# Patient Record
Sex: Female | Born: 1985 | Race: Black or African American | Hispanic: No | Marital: Single | Smoking: Never smoker
Health system: Southern US, Community
[De-identification: ages and names within clinical notes are randomized; demographics above are authoritative.]

## PROBLEM LIST (undated history)

## (undated) DIAGNOSIS — O139 Gestational [pregnancy-induced] hypertension without significant proteinuria, unspecified trimester: Secondary | ICD-10-CM

## (undated) HISTORY — DX: Gestational (pregnancy-induced) hypertension without significant proteinuria, unspecified trimester: O13.9

---

## 2008-10-12 ENCOUNTER — Ambulatory Visit (HOSPITAL_COMMUNITY): Admission: RE | Admit: 2008-10-12 | Discharge: 2008-10-12 | Payer: Self-pay | Admitting: Obstetrics

## 2009-01-08 ENCOUNTER — Ambulatory Visit (HOSPITAL_COMMUNITY): Admission: RE | Admit: 2009-01-08 | Discharge: 2009-01-08 | Payer: Self-pay | Admitting: Obstetrics

## 2009-01-25 ENCOUNTER — Inpatient Hospital Stay (HOSPITAL_COMMUNITY): Admission: AD | Admit: 2009-01-25 | Discharge: 2009-01-31 | Payer: Self-pay | Admitting: Obstetrics

## 2009-03-23 ENCOUNTER — Ambulatory Visit: Payer: Self-pay | Admitting: Internal Medicine

## 2009-03-23 DIAGNOSIS — I10 Essential (primary) hypertension: Secondary | ICD-10-CM | POA: Insufficient documentation

## 2009-04-20 ENCOUNTER — Ambulatory Visit: Payer: Self-pay | Admitting: Internal Medicine

## 2009-06-22 ENCOUNTER — Ambulatory Visit: Payer: Self-pay | Admitting: Internal Medicine

## 2010-12-13 LAB — COMPREHENSIVE METABOLIC PANEL
ALT: 9 U/L (ref 0–35)
Alkaline Phosphatase: 146 U/L — ABNORMAL HIGH (ref 39–117)
CO2: 23 mEq/L (ref 19–32)
Chloride: 104 mEq/L (ref 96–112)
GFR calc non Af Amer: >60 mL/min (ref >60)
Glucose, Bld: 81 mg/dL (ref 70–99)
Potassium: 3.2 mEq/L — ABNORMAL LOW (ref 3.5–5.1)
Sodium: 136 mEq/L (ref 135–145)

## 2010-12-13 LAB — CBC
Hemoglobin: 11.4 g/dL — ABNORMAL LOW (ref 12.0–15.0)
MCHC: 33.9 g/dL (ref 30.0–36.0)
MCV: 82.6 fL (ref 78.0–100.0)
MCV: 84.8 fL (ref 78.0–100.0)
Platelets: 144 10*3/uL — ABNORMAL LOW (ref 150–400)
Platelets: 166 10*3/uL (ref 150–400)
RBC: 3.78 MIL/uL — ABNORMAL LOW (ref 3.87–5.11)
RBC: 4.07 MIL/uL (ref 3.87–5.11)
WBC: 16.9 10*3/uL — ABNORMAL HIGH (ref 4.0–10.5)

## 2010-12-13 LAB — LACTATE DEHYDROGENASE: LDH: 160 U/L (ref 94–250)

## 2011-09-05 NOTE — L&D Delivery Note (Signed)
Delivery Note At 4:19 PM a viable female was delivered via Vaginal, Spontaneous Delivery (Presentation: Vertex ;  ).  APGAR: 8, 9; weight 8 lb 5.9 oz (3795 g).   Placenta status: Intact, Spontaneous.  Cord: 3 vessels with the following complications: None.  Cord pH: none  Anesthesia: Epidural  Episiotomy: None Lacerations: None Suture Repair: none Est. Blood Loss (mL): 300  Mom to postpartum.  Baby to nursery-stable.  Susan Burke A 11/29/2011, 5:28 PM

## 2011-10-23 LAB — ABO/RH: RH Type: POSITIVE

## 2011-10-23 LAB — ANTIBODY SCREEN: Antibody Screen: NEGATIVE

## 2011-10-23 LAB — RUBELLA ANTIBODY, IGM: Rubella: IMMUNE

## 2011-10-23 LAB — HIV ANTIBODY (ROUTINE TESTING W REFLEX): HIV: NONREACTIVE

## 2011-10-23 LAB — HEPATITIS B SURFACE ANTIGEN: Hepatitis B Surface Ag: NEGATIVE

## 2011-11-27 ENCOUNTER — Telehealth (HOSPITAL_COMMUNITY): Payer: Self-pay | Admitting: *Deleted

## 2011-11-27 ENCOUNTER — Other Ambulatory Visit: Payer: Self-pay | Admitting: Obstetrics & Gynecology

## 2011-11-27 NOTE — Telephone Encounter (Signed)
Preadmission screen  

## 2011-11-28 ENCOUNTER — Encounter (HOSPITAL_COMMUNITY): Payer: Self-pay | Admitting: *Deleted

## 2011-11-28 ENCOUNTER — Inpatient Hospital Stay (HOSPITAL_COMMUNITY): Admission: RE | Admit: 2011-11-28 | Payer: Self-pay | Source: Ambulatory Visit

## 2011-11-28 NOTE — Telephone Encounter (Signed)
Preadmission screen  

## 2011-11-29 ENCOUNTER — Encounter (HOSPITAL_COMMUNITY): Payer: Self-pay

## 2011-11-29 ENCOUNTER — Inpatient Hospital Stay (HOSPITAL_COMMUNITY)
Admission: RE | Admit: 2011-11-29 | Discharge: 2011-12-01 | DRG: 774 | Disposition: A | Discharge status: Home / Self Care | Payer: 59 | Source: Ambulatory Visit | Attending: Obstetrics & Gynecology | Admitting: Obstetrics & Gynecology

## 2011-11-29 ENCOUNTER — Inpatient Hospital Stay (HOSPITAL_COMMUNITY): Payer: 59 | Admitting: Anesthesiology

## 2011-11-29 ENCOUNTER — Encounter (HOSPITAL_COMMUNITY): Payer: Self-pay | Admitting: Anesthesiology

## 2011-11-29 VITALS — BP 151/98 | HR 91 | Temp 98.3°F | Resp 18 | Ht 63.0 in | Wt 189.0 lb

## 2011-11-29 DIAGNOSIS — I10 Essential (primary) hypertension: Secondary | ICD-10-CM | POA: Insufficient documentation

## 2011-11-29 DIAGNOSIS — O2650 Maternal hypotension syndrome, unspecified trimester: Secondary | ICD-10-CM | POA: Diagnosis not present

## 2011-11-29 DIAGNOSIS — O139 Gestational [pregnancy-induced] hypertension without significant proteinuria, unspecified trimester: Principal | ICD-10-CM | POA: Diagnosis present

## 2011-11-29 LAB — URINALYSIS, DIPSTICK ONLY
Hgb urine dipstick: NEGATIVE
Nitrite: NEGATIVE
Specific Gravity, Urine: 1.02 (ref 1.005–1.030)
Urobilinogen, UA: 0.2 mg/dL (ref 0.0–1.0)

## 2011-11-29 LAB — APTT: aPTT: 28 seconds (ref 24–37)

## 2011-11-29 LAB — PROTIME-INR
INR: 1.11 (ref 0.00–1.49)
Prothrombin Time: 14.5 seconds (ref 11.6–15.2)

## 2011-11-29 LAB — CBC
HCT: 23.7 % — ABNORMAL LOW (ref 36.0–46.0)
Hemoglobin: 7.5 g/dL — ABNORMAL LOW (ref 12.0–15.0)
MCH: 24.5 pg — ABNORMAL LOW (ref 26.0–34.0)
MCV: 77.6 fL — ABNORMAL LOW (ref 78.0–100.0)
Platelets: 196 10*3/uL (ref 150–400)
RBC: 4.16 MIL/uL (ref 3.87–5.11)
RDW: 15.6 % — ABNORMAL HIGH (ref 11.5–15.5)
RDW: 15.7 % — ABNORMAL HIGH (ref 11.5–15.5)
WBC: 17.5 10*3/uL — ABNORMAL HIGH (ref 4.0–10.5)

## 2011-11-29 LAB — LACTATE DEHYDROGENASE: LDH: 253 U/L — ABNORMAL HIGH (ref 94–250)

## 2011-11-29 LAB — COMPREHENSIVE METABOLIC PANEL
Albumin: 2.9 g/dL — ABNORMAL LOW (ref 3.5–5.2)
BUN: 6 mg/dL (ref 6–23)
Chloride: 104 mEq/L (ref 96–112)
Creatinine, Ser: 0.7 mg/dL (ref 0.50–1.10)
GFR calc Af Amer: >90 mL/min (ref >90)
GFR calc non Af Amer: >90 mL/min (ref >90)
Glucose, Bld: 78 mg/dL (ref 70–99)
Total Bilirubin: 0.2 mg/dL — ABNORMAL LOW (ref 0.3–1.2)

## 2011-11-29 MED ORDER — LACTATED RINGERS IV BOLUS (SEPSIS)
1000.0000 mL | Freq: Once | INTRAVENOUS | Status: AC
  Administered 2011-11-29 (×2): 1000 mL via INTRAVENOUS

## 2011-11-29 MED ORDER — OXYTOCIN 20 UNITS IN LACTATED RINGERS INFUSION - SIMPLE: 125.0000 mL/h | Freq: Once | INTRAVENOUS | Status: DC

## 2011-11-29 MED ORDER — LIDOCAINE HCL (PF) 1 % IJ SOLN
30.0000 mL | INTRAMUSCULAR | Status: DC | PRN
  Filled 2011-11-29 (×2): qty 30

## 2011-11-29 MED ORDER — PHENYLEPHRINE 40 MCG/ML (10ML) SYRINGE FOR IV PUSH (FOR BLOOD PRESSURE SUPPORT): 80.0000 ug | PREFILLED_SYRINGE | INTRAVENOUS | Status: DC | PRN

## 2011-11-29 MED ORDER — OXYTOCIN BOLUS FROM INFUSION
500.0000 mL | Freq: Once | INTRAVENOUS | Status: DC
  Filled 2011-11-29: qty 500

## 2011-11-29 MED ORDER — WITCH HAZEL-GLYCERIN EX PADS
1.0000 "application " | MEDICATED_PAD | CUTANEOUS | Status: DC | PRN
  Administered 2011-11-30: 1 via TOPICAL

## 2011-11-29 MED ORDER — LANOLIN HYDROUS EX OINT: TOPICAL_OINTMENT | CUTANEOUS | Status: DC | PRN

## 2011-11-29 MED ORDER — HYDROXYZINE HCL 50 MG PO TABS
50.0000 mg | ORAL_TABLET | Freq: Four times a day (QID) | ORAL | Status: DC | PRN
  Filled 2011-11-29: qty 1

## 2011-11-29 MED ORDER — BUTORPHANOL TARTRATE 2 MG/ML IJ SOLN: 1.0000 mg | INTRAMUSCULAR | Status: DC | PRN

## 2011-11-29 MED ORDER — ACETAMINOPHEN 325 MG PO TABS: 650.0000 mg | ORAL_TABLET | ORAL | Status: DC | PRN

## 2011-11-29 MED ORDER — PHENYLEPHRINE 40 MCG/ML (10ML) SYRINGE FOR IV PUSH (FOR BLOOD PRESSURE SUPPORT)
80.0000 ug | PREFILLED_SYRINGE | INTRAVENOUS | Status: DC | PRN
  Filled 2011-11-29: qty 5

## 2011-11-29 MED ORDER — SODIUM BICARBONATE 8.4 % IV SOLN
INTRAVENOUS | Status: DC | PRN
  Administered 2011-11-29: 4 mL via EPIDURAL

## 2011-11-29 MED ORDER — DIBUCAINE 1 % RE OINT: 1.0000 "application " | TOPICAL_OINTMENT | RECTAL | Status: DC | PRN

## 2011-11-29 MED ORDER — BENZOCAINE-MENTHOL 20-0.5 % EX AERO: 1.0000 "application " | INHALATION_SPRAY | CUTANEOUS | Status: DC | PRN

## 2011-11-29 MED ORDER — ONDANSETRON HCL 4 MG/2ML IJ SOLN: 4.0000 mg | Freq: Four times a day (QID) | INTRAMUSCULAR | Status: DC | PRN

## 2011-11-29 MED ORDER — SENNOSIDES-DOCUSATE SODIUM 8.6-50 MG PO TABS: 2.0000 | ORAL_TABLET | Freq: Every day | ORAL | Status: DC

## 2011-11-29 MED ORDER — OXYCODONE-ACETAMINOPHEN 5-325 MG PO TABS: 1.0000 | ORAL_TABLET | ORAL | Status: DC | PRN

## 2011-11-29 MED ORDER — DIPHENHYDRAMINE HCL 50 MG/ML IJ SOLN: 12.5000 mg | INTRAMUSCULAR | Status: DC | PRN

## 2011-11-29 MED ORDER — OXYTOCIN 10 UNIT/ML IJ SOLN
40.0000 [IU] | INTRAVENOUS | Status: DC
  Administered 2011-11-29: 40 [IU] via INTRAVENOUS
  Filled 2011-11-29: qty 4

## 2011-11-29 MED ORDER — ONDANSETRON HCL 4 MG/2ML IJ SOLN: 4.0000 mg | INTRAMUSCULAR | Status: DC | PRN

## 2011-11-29 MED ORDER — OXYTOCIN 20 UNITS IN LACTATED RINGERS INFUSION - SIMPLE: 500.0000 mL/h | INTRAVENOUS | Status: DC

## 2011-11-29 MED ORDER — LABETALOL HCL 200 MG PO TABS
200.0000 mg | ORAL_TABLET | Freq: Three times a day (TID) | ORAL | Status: DC
Start: 2011-11-29 — End: 2011-12-01
  Administered 2011-11-29 – 2011-12-01 (×5): 200 mg via ORAL
  Filled 2011-11-29 (×5): qty 1

## 2011-11-29 MED ORDER — DIPHENHYDRAMINE HCL 25 MG PO CAPS: 25.0000 mg | ORAL_CAPSULE | Freq: Four times a day (QID) | ORAL | Status: DC | PRN

## 2011-11-29 MED ORDER — HYDROXYZINE HCL 50 MG/ML IM SOLN
50.0000 mg | Freq: Four times a day (QID) | INTRAMUSCULAR | Status: DC | PRN
  Filled 2011-11-29: qty 1

## 2011-11-29 MED ORDER — LACTATED RINGERS IV SOLN: 500.0000 mL | INTRAVENOUS | Status: DC | PRN

## 2011-11-29 MED ORDER — MISOPROSTOL 25 MCG QUARTER TABLET: 25.0000 ug | ORAL_TABLET | ORAL | Status: DC

## 2011-11-29 MED ORDER — CARBOPROST TROMETHAMINE 250 MCG/ML IM SOLN
INTRAMUSCULAR | Status: AC
  Administered 2011-11-29: 250 ug
  Filled 2011-11-29: qty 1

## 2011-11-29 MED ORDER — TETANUS-DIPHTH-ACELL PERTUSSIS 5-2.5-18.5 LF-MCG/0.5 IM SUSP: 0.5000 mL | Freq: Once | INTRAMUSCULAR | Status: DC

## 2011-11-29 MED ORDER — EPHEDRINE 5 MG/ML INJ
10.0000 mg | INTRAVENOUS | Status: DC | PRN
  Filled 2011-11-29: qty 4

## 2011-11-29 MED ORDER — OXYTOCIN 20 UNITS IN LACTATED RINGERS INFUSION - SIMPLE
1.0000 m[IU]/min | INTRAVENOUS | Status: DC
  Filled 2011-11-29 (×2): qty 1000

## 2011-11-29 MED ORDER — OXYTOCIN BOLUS FROM INFUSION: 500.0000 mL | Freq: Once | INTRAVENOUS | Status: DC

## 2011-11-29 MED ORDER — OXYTOCIN 20 UNITS IN LACTATED RINGERS INFUSION - SIMPLE: 1.0000 m[IU]/min | INTRAVENOUS | Status: DC

## 2011-11-29 MED ORDER — FENTANYL 2.5 MCG/ML BUPIVACAINE 1/10 % EPIDURAL INFUSION (WH - ANES)
14.0000 mL/h | INTRAMUSCULAR | Status: DC
  Filled 2011-11-29: qty 60

## 2011-11-29 MED ORDER — IBUPROFEN 600 MG PO TABS
600.0000 mg | ORAL_TABLET | Freq: Four times a day (QID) | ORAL | Status: DC
  Administered 2011-11-29 – 2011-12-01 (×6): 600 mg via ORAL
  Filled 2011-11-29 (×6): qty 1

## 2011-11-29 MED ORDER — SIMETHICONE 80 MG PO CHEW: 80.0000 mg | CHEWABLE_TABLET | ORAL | Status: DC | PRN

## 2011-11-29 MED ORDER — IBUPROFEN 600 MG PO TABS
600.0000 mg | ORAL_TABLET | Freq: Four times a day (QID) | ORAL | Status: DC | PRN
Start: 2011-11-29 — End: 2011-11-29

## 2011-11-29 MED ORDER — LABETALOL HCL 200 MG PO TABS
200.0000 mg | ORAL_TABLET | Freq: Three times a day (TID) | ORAL | Status: DC
  Administered 2011-11-29 (×2): 200 mg via ORAL
  Filled 2011-11-29 (×4): qty 1

## 2011-11-29 MED ORDER — TERBUTALINE SULFATE 1 MG/ML IJ SOLN: 0.2500 mg | Freq: Once | INTRAMUSCULAR | Status: DC | PRN

## 2011-11-29 MED ORDER — ZOLPIDEM TARTRATE 5 MG PO TABS: 5.0000 mg | ORAL_TABLET | Freq: Every evening | ORAL | Status: DC | PRN

## 2011-11-29 MED ORDER — PRENATAL MULTIVITAMIN CH
1.0000 | ORAL_TABLET | Freq: Every day | ORAL | Status: DC
  Administered 2011-11-30 – 2011-12-01 (×2): 1 via ORAL
  Filled 2011-11-29 (×2): qty 1

## 2011-11-29 MED ORDER — LACTATED RINGERS IV SOLN
INTRAVENOUS | Status: DC
  Administered 2011-11-29 (×3): via INTRAVENOUS

## 2011-11-29 MED ORDER — OXYTOCIN 20 UNITS IN LACTATED RINGERS INFUSION - SIMPLE: 125.0000 mL/h | INTRAVENOUS | Status: DC | PRN

## 2011-11-29 MED ORDER — EPHEDRINE 5 MG/ML INJ: 10.0000 mg | INTRAVENOUS | Status: DC | PRN

## 2011-11-29 MED ORDER — CITRIC ACID-SODIUM CITRATE 334-500 MG/5ML PO SOLN: 30.0000 mL | ORAL | Status: DC | PRN

## 2011-11-29 MED ORDER — FENTANYL 2.5 MCG/ML BUPIVACAINE 1/10 % EPIDURAL INFUSION (WH - ANES)
INTRAMUSCULAR | Status: DC | PRN
  Administered 2011-11-29: 13 mL/h via EPIDURAL

## 2011-11-29 MED ORDER — LACTATED RINGERS IV SOLN: 500.0000 mL | Freq: Once | INTRAVENOUS | Status: DC

## 2011-11-29 MED ORDER — ONDANSETRON HCL 4 MG PO TABS: 4.0000 mg | ORAL_TABLET | ORAL | Status: DC | PRN

## 2011-11-29 MED ORDER — MISOPROSTOL 25 MCG QUARTER TABLET
25.0000 ug | ORAL_TABLET | ORAL | Status: DC
Start: 2011-11-29 — End: 2011-11-29
  Administered 2011-11-29: 25 ug via VAGINAL
  Filled 2011-11-29 (×5): qty 1
  Filled 2011-11-29: qty 0.25

## 2011-11-29 NOTE — Progress Notes (Signed)
Patient ID: Susan Burke, female   DOB: February 14, 1986, 26 y.o.   MRN: [id number] Subjective: CTSP s/p a presyncopal episode/heavy vaginal bleeding  Objective: Vital signs in last 24 hours: Temp:  [98.3 F (36.8 C)-99.3 F (37.4 C)] 99.3 F (37.4 C) (03/27 2016) Pulse Rate:  [28-121] 93  (03/27 2016) Resp:  [16-20] 16  (03/27 2016) BP: (129-173)/(73-135) 146/98 mmHg (03/27 2016) SpO2:  [95 %-100 %] 96 % (03/27 1621) Weight:  [85.73 kg (189 lb)] 85.73 kg (189 lb) (03/27 0753)      General:  Alert FH:  @ U/firm SVE: no clots in LUS     Scheduled Meds:   . ibuprofen  600 mg Oral Q6H  . labetalol  200 mg Oral TID  . lactated ringers  1,000 mL Intravenous Once  . prenatal multivitamin  1 tablet Oral Daily  . senna-docusate  2 tablet Oral QHS  . TDaP  0.5 mL Intramuscular Once  . DISCONTD: carboprost      . DISCONTD: labetalol  200 mg Oral TID  . DISCONTD: lactated ringers  500 mL Intravenous Once  . DISCONTD: misoprostol  25 mcg Vaginal Q4H  . DISCONTD: misoprostol  25 mcg Vaginal Q4H  . DISCONTD: oxytocin 20 units in LR 1000 mL  500 mL Intravenous Once  . DISCONTD: oxytocin 20 units in LR 1000 mL  500 mL Intravenous Once  . DISCONTD: oxytocin 20 units in LR 1000 mL  125 mL/hr Intravenous Once  . DISCONTD: oxytocin 20 units in LR 1000 mL  125 mL/hr Intravenous Once    Assessment/Plan: ?Vasovagal  Episode ?Episode of uterine atony--good tone s/p Hemabate  -->Foley catheter -->CBC/PT/PTT -->Pitocin -->IVF bolus now -->Monitor PV loss/I&O    LOS: 0 days   JACKSON-MOORE,Chanse Kagel A

## 2011-11-29 NOTE — Progress Notes (Signed)
Susan Burke is a 26 y.o. G2P1001 at [redacted]w[redacted]d by LMP admitted for induction of labor due to Hypertension.  Subjective:   Objective: BP 153/99  Pulse 80  Temp(Src) 98.3 F (36.8 C) (Oral)  Resp 20  Ht 5\' 3"  (1.6 m)  Wt 85.73 kg (189 lb)  BMI 33.48 kg/m2  SpO2 100%  LMP 03/24/2011      FHT:  FHR: 150 bpm, variability: moderate,  accelerations:  Present,  decelerations:  Absent UC:   regular, every 2-5 minutes SVE:   Dilation: 4 Effacement (%): 60 Station: -2 Exam by:: Clearence Cheek RN  Labs: Lab Results  Component Value Date   WBC 9.6 11/29/2011   HGB 10.2* 11/29/2011   HCT 32.3* 11/29/2011   MCV 77.6* 11/29/2011   PLT 196 11/29/2011    Assessment / Plan: Induction of labor due to gestational hypertension,  progressing well on pitocin  Labor: Progressing normally Preeclampsia:  no signs or symptoms of toxicity Fetal Wellbeing:  Category I Pain Control:  Epidural I/D:  n/a Anticipated MOD:  NSVD  Susan Burke A 11/29/2011, 2:26 PM

## 2011-11-29 NOTE — Anesthesia Procedure Notes (Signed)

## 2011-11-29 NOTE — H&P (Signed)
Susan Burke is a 26 y.o. female presenting for IOL for PIH.. Maternal Medical History:  Fetal activity: Perceived fetal activity is normal.   Last perceived fetal movement was within the past hour.    Prenatal complications: Hypertension.   Prenatal Complications - Diabetes: none.    OB History    Grav Para Term Preterm Abortions TAB SAB Ect Mult Living   2 1 1       1      Past Medical History  Diagnosis Date  . Pregnancy induced hypertension    History reviewed. No pertinent past surgical history. Family History: family history includes Diabetes in her maternal grandmother and Hypertension in her maternal grandmother. Social History:  reports that she has never smoked. She has never used smokeless tobacco. She reports that she does not drink alcohol or use illicit drugs.  Review of Systems  All other systems reviewed and are negative.    Dilation: 2 Effacement (%): Thick Station: -3 Exam by:: kConnie Peach RN Blood pressure 161/101, pulse 74, temperature 99 F (37.2 C), temperature source Oral, resp. rate 20, height 5\' 3"  (1.6 m), weight 85.73 kg (189 lb), last menstrual period 03/24/2011. Maternal Exam:  Abdomen: Patient reports no abdominal tenderness. Fetal presentation: vertex  Introitus: Normal vulva. Normal vagina.  Pelvis: adequate for delivery.   Cervix: Cervix evaluated by digital exam.     Physical Exam  Nursing note and vitals reviewed. Constitutional: She is oriented to person, place, and time. She appears well-developed and well-nourished.  HENT:  Head: Normocephalic.  Eyes: Conjunctivae are normal. Pupils are equal, round, and reactive to light.  Neck: Normal range of motion. Neck supple.  Cardiovascular: Normal rate and regular rhythm.   Respiratory: Effort normal.  GI: Soft.  Genitourinary: Vagina normal and uterus normal.  Musculoskeletal: Normal range of motion.  Neurological: She is alert and oriented to person, place, and time.  Skin: Skin  is warm and dry.  Psychiatric: She has a normal mood and affect. Her behavior is normal. Judgment and thought content normal.    Prenatal labs: ABO, Rh: A/Positive/-- (02/18 0000) Antibody: Negative (02/18 0000) Rubella: Immune (02/18 0000) RPR: Nonreactive (02/18 0000)  HBsAg: Negative (02/18 0000)  HIV: Non-reactive (02/18 0000)  GBS: Negative (03/15 0000)   Assessment/Plan: 39 weeks.  PIH.  2 stage IOL.   Rebeka Kimble A 11/29/2011, 8:32 AM

## 2011-11-29 NOTE — Progress Notes (Signed)
Susan Burke is a 26 y.o. G2P1001 at [redacted]w[redacted]d by LMP admitted for induction of labor due to Post dates. Due date 12-06-2011.  Subjective:   Objective: BP 161/101  Pulse 74  Temp(Src) 99 F (37.2 C) (Oral)  Resp 20  Ht 5\' 3"  (1.6 m)  Wt 85.73 kg (189 lb)  BMI 33.48 kg/m2  LMP 03/24/2011      FHT:  FHR: 150 bpm, variability: moderate,  accelerations:  Present,  decelerations:  Absent UC:   Occasional, mild SVE:   Dilation: 2 Effacement (%): Thick Station: -3 Exam by:: [employer] RN  Labs: Lab Results  Component Value Date   WBC 9.6 11/29/2011   HGB 10.2* 11/29/2011   HCT 32.3* 11/29/2011   MCV 77.6* 11/29/2011   PLT 196 11/29/2011    Assessment / Plan: Induction of labor due to gestational hypertension,  progressing well on pitocin  Labor:   Cytotec cervical ripening started. Preeclampsia:  no signs or symptoms of toxicity Fetal Wellbeing:  Category I Pain Control:  Labor support without medications I/D:  n/a Anticipated MOD:  NSVD  Susan Burke A 11/29/2011, 8:38 AM

## 2011-11-29 NOTE — Anesthesia Preprocedure Evaluation (Addendum)
Anesthesia Evaluation  Patient identified by MRN, date of birth, ID band Patient awake    Reviewed: Allergy & Precautions, H&P , Patient's Chart, lab work & pertinent test results  Airway Mallampati: II TM Distance: >3 FB Neck ROM: full    Dental  (+) Teeth Intact   Pulmonary  breath sounds clear to auscultation        Cardiovascular hypertension, On Medications Rhythm:regular Rate:Normal     Neuro/Psych    GI/Hepatic   Endo/Other    Renal/GU      Musculoskeletal   Abdominal   Peds  Hematology   Anesthesia Other Findings       Reproductive/Obstetrics (+) Pregnancy                           Anesthesia Physical Anesthesia Plan  ASA: III  Anesthesia Plan: Epidural   Post-op Pain Management:    Induction:   Airway Management Planned:   Additional Equipment:   Intra-op Plan:   Post-operative Plan:   Informed Consent: I have reviewed the patients History and Physical, chart, labs and discussed the procedure including the risks, benefits and alternatives for the proposed anesthesia with the patient or authorized representative who has indicated his/her understanding and acceptance.   Dental Advisory Given  Plan Discussed with:   Anesthesia Plan Comments: (Labs checked- platelets confirmed with RN in room. Fetal heart tracing, per RN, reported to be stable enough for sitting procedure. Discussed epidural, and patient consents to the procedure:  included risk of possible headache,backache, failed block, allergic reaction, and nerve injury. This patient was asked if she had any questions or concerns before the procedure started.)        Anesthesia Quick Evaluation  

## 2011-11-29 NOTE — Progress Notes (Signed)
HAIDYN KILBURG is a 26 y.o. G2P2002 at [redacted]w[redacted]d by LMP admitted for induction of labor due to Hypertension.  Subjective:   Objective: BP 159/91  Pulse 77  Temp(Src) 98.6 F (37 C) (Oral)  Resp 20  Ht 5\' 3"  (1.6 m)  Wt 85.73 kg (189 lb)  BMI 33.48 kg/m2  SpO2 96%  LMP 03/24/2011  Breastfeeding? Unknown   Total I/O In: -  Out: 300 [Blood:300]  FHT:  FHR: 140 bpm, variability: moderate,  accelerations:  Present,  decelerations:  Present variables UC:   regular, every 3 minutes SVE:   Dilation: 10 Effacement (%): 100 Station: +1;+2 Exam by:: Clearence Cheek RN  Labs: Lab Results  Component Value Date   WBC 9.6 11/29/2011   HGB 10.2* 11/29/2011   HCT 32.3* 11/29/2011   MCV 77.6* 11/29/2011   PLT 196 11/29/2011    Assessment / Plan: Induction of labor due to gestational hypertension,  progressing well on pitocin  Labor: Progressing normally Preeclampsia:  n/a Fetal Wellbeing:  Category II Pain Control:  Epidural I/D:  n/a Anticipated MOD:  NSVD  Dhruva Orndoff A 11/29/2011, 5:31 PM

## 2011-11-30 ENCOUNTER — Inpatient Hospital Stay (HOSPITAL_COMMUNITY): Admission: RE | Admit: 2011-11-30 | Payer: 59 | Source: Ambulatory Visit | Admitting: Obstetrics & Gynecology

## 2011-11-30 LAB — BASIC METABOLIC PANEL
Chloride: 104 mEq/L (ref 96–112)
Creatinine, Ser: 0.67 mg/dL (ref 0.50–1.10)
GFR calc Af Amer: >90 mL/min (ref >90)
GFR calc non Af Amer: >90 mL/min (ref >90)
Potassium: 4.2 mEq/L (ref 3.5–5.1)

## 2011-11-30 LAB — CBC
Platelets: 152 10*3/uL (ref 150–400)
RBC: 2.65 MIL/uL — ABNORMAL LOW (ref 3.87–5.11)
RDW: 15.8 % — ABNORMAL HIGH (ref 11.5–15.5)
WBC: 16.6 10*3/uL — ABNORMAL HIGH (ref 4.0–10.5)

## 2011-11-30 LAB — SAMPLE TO BLOOD BANK

## 2011-11-30 MED ORDER — LACTATED RINGERS IV SOLN
INTRAVENOUS | Status: DC
  Administered 2011-11-30: 07:00:00 via INTRAVENOUS

## 2011-11-30 NOTE — Progress Notes (Signed)
Post Partum Day 1 Subjective: no complaints, up ad lib, voiding, tolerating PO and + flatus  Objective: Blood pressure 112/72, pulse 90, temperature 98.1 F (36.7 C), temperature source Oral, resp. rate 18, height 5\' 3"  (1.6 m), weight 85.73 kg (189 lb), last menstrual period 03/24/2011, SpO2 97.00%, unknown if currently breastfeeding.  Physical Exam:  General: alert and no distress Lochia: appropriate Uterine Fundus: firm Incision: n/a DVT Evaluation: No evidence of DVT seen on physical exam.   Basename 11/30/11 0545 11/29/11 2235  HGB 6.6* 7.5*  HCT 20.7* 23.7*    Assessment/Plan: Plan for discharge tomorrow   LOS: 1 day   Coe Angelos A 11/30/2011, 8:41 AM

## 2011-11-30 NOTE — Anesthesia Postprocedure Evaluation (Signed)
  Anesthesia Post-op Note  Patient: Susan Burke  Procedure(s) Performed: * No surgery found *  Patient Location: Mother/Baby  Anesthesia Type: Epidural  Level of Consciousness: awake  Airway and Oxygen Therapy: Patient Spontanous Breathing  Post-op Pain: none  Post-op Assessment: Patient's Cardiovascular Status Stable, Respiratory Function Stable, Patent Airway, No signs of Nausea or vomiting, Adequate PO intake, Pain level controlled, No headache, No backache, No residual numbness and No residual motor weakness  Post-op Vital Signs: Reviewed and stable  Complications: No apparent anesthesia complications

## 2011-11-30 NOTE — Progress Notes (Signed)
CRITICAL VALUE ALERT  Critical value received:  Hgb: 6.6  Date of notification:  11/30/11  Time of notification:  0623  Critical value read back:yes  Nurse who received alert:  Mont Dutton, RN  MD notified (1st page):    Time of first page:   MD notified (2nd page):  Time of second page:  Responding MD:    Time MD responded:

## 2011-11-30 NOTE — Plan of Care (Signed)
Problem: Discharge Progression Outcomes Goal: Barriers To Progression Addressed/Resolved Outcome: Completed/Met Date Met:  11/30/11 ppHemm  11/29/11

## 2011-11-30 NOTE — Progress Notes (Signed)
UR Chart review completed.  

## 2011-12-01 MED ORDER — LABETALOL HCL 200 MG PO TABS: 200.0000 mg | ORAL_TABLET | Freq: Three times a day (TID) | ORAL | Status: DC

## 2011-12-01 MED ORDER — FERROUS SULFATE 325 (65 FE) MG PO TABS
325.0000 mg | ORAL_TABLET | Freq: Three times a day (TID) | ORAL | Status: DC
  Administered 2011-12-01: 325 mg via ORAL
  Filled 2011-12-01: qty 1

## 2011-12-01 MED ORDER — FERROUS SULFATE 325 (65 FE) MG PO TABS: 325.0000 mg | ORAL_TABLET | Freq: Three times a day (TID) | ORAL | Status: DC

## 2011-12-01 MED ORDER — IBUPROFEN 600 MG PO TABS: 600.0000 mg | ORAL_TABLET | Freq: Four times a day (QID) | ORAL | Status: AC | PRN

## 2011-12-01 NOTE — Discharge Instructions (Signed)
Hypertension Information As your heart beats, it forces blood through your arteries. This force is your blood pressure. If the pressure is too high, it is called hypertension (HTN) or high blood pressure. HTN is dangerous because you may have it and not know it. High blood pressure may mean that your heart has to work harder to pump blood. Your arteries may be narrow or stiff. The extra work puts you at risk for heart disease, stroke, and other problems.  Blood pressure consists of two numbers, a higher number over a lower, 110/72, for example. It is stated as "110 over 72." The ideal is below 120 for the top number (systolic) and under 80 for the bottom (diastolic).  You should pay close attention to your blood pressure if you have certain conditions such as:  Heart failure.   Prior heart attack.   Diabetes   Chronic kidney disease.   Prior stroke.   Multiple risk factors for heart disease.  To see if you have HTN, your blood pressure should be measured while you are seated with your arm held at the level of the heart. It should be measured at least twice. A one-time elevated blood pressure reading (especially in the Emergency Department) does not mean that you need treatment. There may be conditions in which the blood pressure is different between your right and left arms. It is important to see your caregiver soon for a recheck. Most people have essential hypertension which means that there is not a specific cause. This type of high blood pressure may be lowered by changing lifestyle factors such as:  Stress.   Smoking.   Lack of exercise.   Excessive weight.   Drug/tobacco/alcohol use.   Eating less salt.  Most people do not have symptoms from high blood pressure until it has caused damage to the body. Effective treatment can often prevent, delay or reduce that damage. TREATMENT  Treatment for high blood pressure, when a cause has been identified, is directed at the cause. There  are a large number of medications to treat HTN. These fall into several categories, and your caregiver will help you select the medicines that are best for you. Medications may have side effects. You should review side effects with your caregiver. If your blood pressure stays high after you have made lifestyle changes or started on medicines,   Your medication(s) may need to be changed.   Other problems may need to be addressed.   Be certain you understand your prescriptions, and know how and when to take your medicine.   Be sure to follow up with your caregiver within the time frame advised (usually within two weeks) to have your blood pressure rechecked and to review your medications.   If you are taking more than one medicine to lower your blood pressure, make sure you know how and at what times they should be taken. Taking two medicines at the same time can result in blood pressure that is too low.  Document Released: 10/24/2005 Document Revised: 05/03/2011 Document Reviewed: 10/31/2007 [facility] Patient Information 2012 [place], [place].  Postpartum Care After Vaginal Delivery After you deliver your baby, you will stay in the hospital for 24 to 72 hours, unless there were problems with the labor or delivery, or you have medical problems. While you are in the hospital, you will receive help and instructions on how to care for yourself and your baby. Your doctor will order pain medicine, in case you need it. You will have  a small amount of bleeding from your vagina and should change your sanitary pad frequently. Wash your hands thoroughly with soap and water for at least 20 seconds after changing pads and using the toilet. Let the nurses know if you begin to pass blood clots or your bleeding increases. Do not flush blood clots down the toilet before having the nurse look at them, to make sure there is no placental tissue with them. If you had an intravenous (IV), it will be removed within 24  hours, if there are no problems. The first time you get out of bed or take a shower, call the nurse to help you because you may get weak, lightheaded, or even faint. If you are breastfeeding, you may feel painful contractions of your uterus for a couple of weeks. This is normal. The contractions help your uterus get back to normal size. If you are not breastfeeding, wear a supportive bra and handle your breasts as little as possible until your milk has dried up. Hormones should not be given to dry up the breasts, because they can cause blood clots. You will be given your normal diet, unless you have diabetes or other medical problems.  The nurses may put an ice pack on your episiotomy (surgically enlarged opening), if you have one, to reduce the pain and swelling. On rare occasions, you may not be able to urinate and the nurse will need to empty your bladder with a catheter. If you had a postpartum tubal ligation ("tying tubes," female sterilization), it should not make your stay in the hospital longer. You may have your baby in your room with you as much as you like, unless you or the baby has a problem. Use the bassinet (basket) for the baby when going to and from the nursery. Do not carry the baby. Do not leave the postpartum area. If the mother is Rh negative (lacks a protein on the red blood cells) and the baby is Rh positive, the mother should get a Rho-gam shot to prevent Rh problems with future pregnancies. You may be given written instructions for you and your baby, and necessary medicines, when you are discharged from the hospital. Be sure you understand and follow the instructions as advised. HOME CARE INSTRUCTIONS   Follow instructions and take the medicines given to you.   Only take over-the-counter or prescription medicines for pain, discomfort, or fever as directed by your caregiver.   Do not take aspirin, because it can cause bleeding.   Increase your activities a little bit every day to  build up your strength and endurance.   Do not drink alcohol, especially if you are breastfeeding or taking pain medicine.   Take your temperature twice a day and record it.   You may have a small amount of bleeding or spotting for 2 to 4 weeks. This is normal.   Do not use tampons or douche. Use sanitary pads.   Try to have someone stay and help you for a few days when you go home.   Try to rest or take a nap when the baby is sleeping.   If you are breastfeeding, wear a good support bra. If you are not breastfeeding, wear a supportive bra and do not stimulate your nipples.   Eat a healthy, nutritious diet and continue to take your prenatal vitamins.   Do not drive, do any heavy activities, or travel until your caregiver tells you it is okay.   Do not have intercourse  until your caregiver gives you permission to do so.   Ask your caregiver when you can begin to exercise and what type of exercises to do.   Call your caregiver if you think you are having a problem from your delivery.   Call your pediatrician if you are having a problem with the baby.   Schedule your postpartum visit and keep it.  SEEK MEDICAL CARE IF:   You have a temperature of 100 F (37.8 C) or higher.   You have increased vaginal bleeding or are passing clots. Save any clots to show your caregiver.   You have bloody urine or pain when you urinate.   You have a bad smelling vaginal discharge.   You have increasing pain or swelling on your episiotomy.   You develop a severe headache.   You feel depressed.   The episiotomy is separating.   You become dizzy or lightheaded.   You develop a rash.   You have a reaction or problems with your medicine.   You have pain, redness, or swelling at the intravenous site.  SEEK IMMEDIATE MEDICAL CARE IF:   You have chest pain.   You develop shortness of breath.   You pass out.   You develop pain, with or without swelling or redness in your leg.   You  develop heavy vaginal bleeding, with or without blood clots.   You develop stomach pain.   You develop a bad smelling vaginal discharge.  MAKE SURE YOU:   Understand these instructions.   Will watch your condition.   Will get help right away if you are not doing well or get worse.  Document Released: 06/18/2007 Document Revised: 08/10/2011 Document Reviewed: 06/30/2009 [facility] Patient Information 2012 [place], [place].  After Your Delivery Discharge Instructions  After Discharge Orders: No future appointments.   Medication List  As of 12/01/2011  7:43 AM   STOP taking these medications         methyldopa 500 MG tablet      OVER THE COUNTER MEDICATION      pyridOXINE 100 MG tablet         TAKE these medications         ferrous sulfate 325 (65 FE) MG tablet   Take 1 tablet (325 mg total) by mouth 3 (three) times daily with meals.      flintstones complete 60 MG chewable tablet   Chew 1 tablet by mouth daily.      ibuprofen 600 MG tablet   Commonly known as: ADVIL,MOTRIN   Take 1 tablet (600 mg total) by mouth every 6 (six) hours as needed for pain.      labetalol 200 MG tablet   Commonly known as: NORMODYNE   Take 1 tablet (200 mg total) by mouth 3 (three) times daily.           Medical equipment: none   After your delivery - signs and symptoms to watch for:  Fever - Oral temperature greater than 100.4 degrees Fahrenheit  Foul-smelling vaginal discharge  Headache unrelieved by "pain meds"  Difficulty urinating  Breasts reddened, hard, hot to the touch  Nipple discharge which is foul-smelling or contains pus  Increased pain at the site of the perineum  Difficulty breathing with or without chest pain  New calf pain especially if only on one side  Sudden, continuing increased vaginal bleeding with or without clots  Unrelieved feelings of:  Inability to cope  Sadness  Anxiety  Lack of  interest in baby  Insomnia  Crying   What to do at  home:  See patient education handouts for full information  Resume activity gradually   Don't lift anything heavier than baby and carrier until OK'd by your Physician or Midwife  No sex until OK'd by your Physician or Midwife  Take care of yourself by sleeping/resting as much as possible  Eat regular nutritious meals  Let someone else care for you, your baby, and housework as much as possible   Take pain medication as prescribed whenever you need them  Wear compression stockings if prescribed   To avoid/relieve constipation take stool softeners if advised   Drink lots of water/fruit juices  Increase fiber in your diet  Breast care: Wear support bra 24/7; use lanolin ointment/cream, nipple shields or cool compresses as needed   Refer to Newborn Discharge Instructions for problems or follow-up regarding nursing

## 2011-12-01 NOTE — Progress Notes (Signed)
Post Partum Day 2 Subjective: no complaints, up ad lib, voiding, tolerating PO and + flatus, Denies HA, visual disturbances, RUQ pain. Desires DC home today.   Objective: Blood pressure 136/83, pulse 102, temperature 98.3 F (36.8 C), temperature source Oral, resp. rate 18, height 5\' 3"  (1.6 m), weight 85.73 kg (189 lb), last menstrual period 03/24/2011, SpO2 97.00%, unknown if currently breastfeeding.  Physical Exam:  General: alert, cooperative, appears stated age and no distress Lochia: appropriate Uterine Fundus: firm Incision: n/a DVT Evaluation: No evidence of DVT seen on physical exam. Negative Homan's sign. No cords or calf tenderness.   Basename 11/30/11 0545 11/29/11 2235  HGB 6.6* 7.5*  HCT 20.7* 23.7*    Assessment/Plan: Discharge home, Breastfeeding and Contraception Progestin-Only pills at 6 weeks.    LOS: 2 days   Burke, Susan Furr 12/01/2011, 7:33 AM

## 2011-12-01 NOTE — Discharge Summary (Signed)
Obstetric Discharge Summary Reason for Admission: induction of labor and gestational hypertension Prenatal Procedures: NST and Preeclampsia labs Intrapartum Procedures: spontaneous vaginal delivery Postpartum Procedures: none Complications-Operative and Postpartum: none Hemoglobin  Date Value Range Status  11/30/2011 6.6* 12.0-15.0 (g/dL) Final     CRITICAL RESULT CALLED TO, READ BACK BY AND VERIFIED WITH:     CHAMPIGNY,W. AT 1610 ON 11/30/2011 BY HOUEGNIFIO M.     REPEATED TO VERIFY     HCT  Date Value Range Status  11/30/2011 20.7* 36.0-46.0 (%) Final    Physical Exam:  General: alert, cooperative, appears stated age and no distress Lochia: appropriate Uterine Fundus: firm Incision: N/A DVT Evaluation: No evidence of DVT seen on physical exam. Negative Homan's sign. No cords or calf tenderness.  Discharge Diagnoses: Term Pregnancy-delivered and Gestational Hypertension  Discharge Information: Date: 12/01/2011 Activity: pelvic rest Diet: routine Medications: Ibuprofen, Iron and flintstones, Labetalol Condition: stable Instructions: refer to DC instructions, follow-up as directed.  Discharge to: home Follow-up Information    Follow up with Antionette Char A, MD in 2 weeks. (BP check in 2 weeks, then routine postpartum visit at 6 weeks., call for earlier appt  as needed)    Contact information:   [address], [address] [place] [address] [phone]          Newborn Data: Live born female  Birth Weight: 8 lb 5.9 oz (3795 g) APGAR: 8, 9  Home with mother.  Susan Burke, Susan Burke 12/01/2011, 7:44 AM

## 2013-10-17 ENCOUNTER — Encounter: Payer: Self-pay | Admitting: Advanced Practice Midwife

## 2013-10-17 ENCOUNTER — Ambulatory Visit (INDEPENDENT_AMBULATORY_CARE_PROVIDER_SITE_OTHER): Payer: No Typology Code available for payment source | Admitting: Advanced Practice Midwife

## 2013-10-17 VITALS — BP 127/82 | HR 79 | Temp 98.0°F | Ht 64.0 in | Wt 167.0 lb

## 2013-10-17 DIAGNOSIS — Z01419 Encounter for gynecological examination (general) (routine) without abnormal findings: Secondary | ICD-10-CM

## 2013-10-17 MED ORDER — NORGESTIMATE-ETH ESTRADIOL 0.25-35 MG-MCG PO TABS: 1.0000 | ORAL_TABLET | Freq: Every day | ORAL | Status: DC

## 2013-10-17 NOTE — Progress Notes (Signed)
Patient in office today for an annual exam. Patient states she would like to talk about birth control. Patient denies any concerns.  Last Pap: 08/31/2008 Results: Normal

## 2013-10-17 NOTE — Progress Notes (Signed)
  Subjective:     Susan Burke is a 28 y.o. female and is here for a comprehensive physical exam. The patient reports no problems. Patient desires GC/CT w/ Pap.  History   Social History  . Marital Status: Single    Spouse Name: N/A    Number of Children: N/A  . Years of Education: N/A   Occupational History  . Not on file.   Social History Main Topics  . Smoking status: Never Smoker   . Smokeless tobacco: Never Used  . Alcohol Use: No  . Drug Use: No  . Sexual Activity: Yes    Partners: Male   Other Topics Concern  . Not on file   Social History Narrative  . No narrative on file   Health Maintenance  Topic Date Due  . Pap Smear  07/09/2004  . Tetanus/tdap  07/09/2005  . Influenza Vaccine  04/04/2013    The following portions of the patient's history were reviewed and updated as appropriate: allergies, current medications, past family history, past medical history, past social history, past surgical history and problem list.  Review of Systems A comprehensive review of systems was negative.   Objective:    BP 127/82  Pulse 79  Temp(Src) 98 F (36.7 C)  Ht 5\' 4"  (1.626 m)  Wt 167 lb (75.751 kg)  BMI 28.65 kg/m2  LMP 10/05/2013 General appearance: alert and cooperative Head: Normocephalic, without obvious abnormality, atraumatic Eyes: conjunctivae/corneas clear. PERRL, EOM's intact. Fundi benign. Nose: Nares normal. Septum midline. Mucosa normal. No drainage or sinus tenderness. Throat: lips, mucosa, and tongue normal; teeth and gums normal Neck: no adenopathy, no carotid bruit, no JVD, supple, symmetrical, trachea midline and thyroid not enlarged, symmetric, no tenderness/mass/nodules Back: symmetric, no curvature. ROM normal. No CVA tenderness. Lungs: clear to auscultation bilaterally and normal percussion bilaterally Breasts: normal appearance, no masses or tenderness Heart: regular rate and rhythm, S1, S2 normal, no murmur, click, rub or  gallop Abdomen: soft, non-tender; bowel sounds normal; no masses,  no organomegaly Pelvic: cervix normal in appearance, external genitalia normal, no adnexal masses or tenderness, no cervical motion tenderness, rectovaginal septum normal, uterus normal size, shape, and consistency and vagina normal without discharge Extremities: extremities normal, atraumatic, no cyanosis or edema Pulses: 2+ and symmetric Skin: Skin color, texture, turgor normal. No rashes or lesions Lymph nodes: Cervical, supraclavicular, and axillary nodes normal. Neurologic: Alert and oriented X 3, normal strength and tone. Normal symmetric reflexes. Normal coordination and gait    Assessment:    Healthy female exam.  Appropriate for OCP      Plan:     See After Visit Summary for Counseling Recommendations  Pap today, GC/CT OCP Sprintec to pharmacy Patient to f/u in 1 year of PRN. Reviewed risk and benefits of OCP. Patient to RTC for BP check PRN. Recommend patient have fasting Cholesterol, CBC, CMP, TSH. Patient may RTC when she would like to complete this.  40 min spent with patient greater than 80% spent in counseling and coordination of care.   Zakkary Thibault Wilson SingerWren CNM

## 2013-10-18 LAB — GC/CHLAMYDIA PROBE AMP
CT PROBE, AMP APTIMA: NEGATIVE
GC Probe RNA: NEGATIVE

## 2013-10-20 LAB — PAP IG, CT-NG, RFX HPV ASCU
CHLAMYDIA PROBE AMP: NEGATIVE
GC Probe Amp: NEGATIVE

## 2013-12-26 ENCOUNTER — Ambulatory Visit: Payer: No Typology Code available for payment source | Admitting: Advanced Practice Midwife

## 2014-05-14 ENCOUNTER — Telehealth: Payer: Self-pay

## 2014-05-14 NOTE — Telephone Encounter (Signed)
Patient called. States she received a call. Does not know who called her. CB # [phone]

## 2014-07-06 ENCOUNTER — Encounter: Payer: Self-pay | Admitting: Advanced Practice Midwife

## 2015-08-19 ENCOUNTER — Encounter: Payer: Self-pay | Admitting: Certified Nurse Midwife

## 2015-08-19 ENCOUNTER — Ambulatory Visit (INDEPENDENT_AMBULATORY_CARE_PROVIDER_SITE_OTHER): Payer: 59 | Admitting: Certified Nurse Midwife

## 2015-08-19 VITALS — BP 129/81 | HR 80 | Temp 99.0°F | Wt 158.0 lb

## 2015-08-19 DIAGNOSIS — Z01419 Encounter for gynecological examination (general) (routine) without abnormal findings: Secondary | ICD-10-CM | POA: Diagnosis not present

## 2015-08-19 MED ORDER — ETONOGESTREL-ETHINYL ESTRADIOL 0.12-0.015 MG/24HR VA RING: 1.0000 | VAGINAL_RING | VAGINAL | Status: DC

## 2015-08-19 NOTE — Progress Notes (Signed)
Patient ID: Susan Burke, female   DOB: March 15, 1986, 29 y.o.   MRN: [id number]   Subjective:        Susan Burke is a 29 y.o. female here for a routine exam.  Current complaints: none.  Desires to stay on her nuva ring.  Monthly regular periods lasting 2-3 days, normal periods.  Declines blood STD screening.      Personal health questionnaire:  Is patient Susan Burke, have a family history of breast and/or ovarian cancer: no Is there a family history of uterine cancer diagnosed at age < 78, gastrointestinal cancer, urinary tract cancer, family member who is a Personnel officer syndrome-associated carrier: no Is the patient overweight and hypertensive, family history of diabetes, personal history of gestational diabetes, preeclampsia or PCOS: no Is patient over 24, have PCOS,  family history of premature CHD under age 76, diabetes, smoke, have hypertension or peripheral artery disease:  yes At any time, has a partner hit, kicked or otherwise hurt or frightened you?: no Over the past 2 weeks, have you felt down, depressed or hopeless?: no Over the past 2 weeks, have you felt little interest or pleasure in doing things?:no   Gynecologic History Patient's last menstrual period was 07/25/2015. Contraception: [name] vaginal inserts Last Pap: 10/17/13. Results were: normal Last mammogram: N/A.   Obstetric History OB History  Gravida Para Term Preterm AB SAB TAB Ectopic Multiple Living  0 0 0 0 0 0 2    # Outcome Date GA Lbr Len/2nd Weight Sex Delivery Anes PTL Lv  2 Term 11/29/11 [redacted]w[redacted]d 04:30 / 00:19 8 lb 5.9 oz (3.795 kg) M Vag-Spont EPI  Y     Comments: NA  1 Term 2010 [redacted]w[redacted]d 10:00 5 lb (2.268 kg) F Vag-Spont EPI  Y      Past Medical History  Diagnosis Date  . Pregnancy induced hypertension     History reviewed. No pertinent past surgical history.   Current outpatient prescriptions:  .  etonogestrel-ethinyl estradiol ([name]) 0.12-0.015 MG/24HR vaginal ring, Place 1 each  vaginally every 28 (twenty-eight) days. Insert vaginally and leave in place for 3 consecutive weeks, then remove for 1 week., Disp: 1 each, Rfl: 12 Allergies  Allergen Reactions  . Penicillins Rash    Social History  Substance Use Topics  . Smoking status: Never Smoker   . Smokeless tobacco: Never Used  . Alcohol Use: No    Family History  Problem Relation Age of Onset  . Diabetes Maternal Grandmother   . Hypertension Maternal Grandmother       Review of Systems  Constitutional: negative for fatigue and weight loss Respiratory: negative for cough and wheezing Cardiovascular: negative for chest pain, fatigue and palpitations Gastrointestinal: negative for abdominal pain and change in bowel habits Musculoskeletal:negative for myalgias Neurological: negative for gait problems and tremors Behavioral/Psych: negative for abusive relationship, depression Endocrine: negative for temperature intolerance   Genitourinary:negative for abnormal menstrual periods, genital lesions, hot flashes, sexual problems and vaginal discharge Integument/breast: negative for breast lump, breast tenderness, nipple discharge and skin lesion(s)    Objective:       BP 129/81 mmHg  Pulse 80  Temp(Src) 99 F (37.2 C)  Wt 158 lb (71.668 kg)  LMP 07/25/2015 General:   alert  Skin:   no rash or abnormalities  Lungs:   clear to auscultation bilaterally  Heart:   regular rate and rhythm, S1, S2 normal, no murmur, click, rub or gallop  Breasts:   normal without  suspicious masses, skin or nipple changes or axillary nodes  Abdomen:  normal findings: no organomegaly, soft, non-tender and no hernia  Pelvis:  External genitalia: normal general appearance Urinary system: urethral meatus normal and bladder without fullness, nontender Vaginal: normal without tenderness, induration or masses Cervix: normal appearance Adnexa: normal bimanual exam Uterus: anteverted and non-tender, normal size   Lab  Review Urine pregnancy test Labs reviewed yes Radiologic studies reviewed no  50% of 30 min visit spent on counseling and coordination of care.   Assessment:    Healthy female exam.   Contraception management  Plan:    Education reviewed: depression evaluation, low fat, low cholesterol diet, safe sex/STD prevention, self breast exams, skin cancer screening and weight bearing exercise. Contraception: [name] vaginal inserts. Follow up in: 1 year.   Meds ordered this encounter  Medications  . DISCONTD: etonogestrel-ethinyl estradiol ([name]) 0.12-0.015 MG/24HR vaginal ring    Sig: Place 1 each vaginally every 28 (twenty-eight) days. Insert vaginally and leave in place for 3 consecutive weeks, then remove for 1 week.  . etonogestrel-ethinyl estradiol ([name]) 0.12-0.015 MG/24HR vaginal ring    Sig: Place 1 each vaginally every 28 (twenty-eight) days. Insert vaginally and leave in place for 3 consecutive weeks, then remove for 1 week.    Dispense:  1 each    Refill:  12   Orders Placed This Encounter  Procedures  . SureSwab, Vaginosis/Vaginitis Plus  . Ambulatory referral to Internal Medicine    Referral Priority:  Routine    Referral Type:  Consultation    Referral Reason:  Specialty Services Required    Requested Specialty:  Internal Medicine    Number of Visits Requested:  1   Possible management options include: another form of contraception

## 2015-08-20 LAB — PAP IG W/ RFLX HPV ASCU

## 2015-08-23 LAB — SURESWAB, VAGINOSIS/VAGINITIS PLUS
Atopobium vaginae: NOT DETECTED Log (cells/mL)
C. PARAPSILOSIS, DNA: NOT DETECTED
C. TROPICALIS, DNA: NOT DETECTED
C. albicans, DNA: NOT DETECTED
C. glabrata, DNA: NOT DETECTED
C. trachomatis RNA, TMA: NOT DETECTED
Gardnerella vaginalis: NOT DETECTED Log (cells/mL)
LACTOBACILLUS SPECIES: DETECTED Log (cells/mL)
MEGASPHAERA SPECIES: NOT DETECTED Log (cells/mL)
N. gonorrhoeae RNA, TMA: NOT DETECTED
T. vaginalis RNA, QL TMA: NOT DETECTED

## 2015-08-31 ENCOUNTER — Ambulatory Visit: Payer: Medicaid Other | Admitting: Internal Medicine

## 2016-08-27 ENCOUNTER — Other Ambulatory Visit: Payer: Self-pay | Admitting: Certified Nurse Midwife

## 2017-09-17 ENCOUNTER — Other Ambulatory Visit: Payer: Self-pay | Admitting: Obstetrics

## 2017-09-17 NOTE — Telephone Encounter (Signed)
Name from pharmacy: [name] VAGINAL RING        Will file in chart as: [name] 0.12-0.015 MG/24HR vaginal ring   Sig: PLACE 1 VAGINALLY EVERY 28 DAYS. INSERT AND LEAVE IN PLACE FOR 3 CONSECUTIVE WKS, THEN REMOVE X 1WK   Disp:  1 each  Refills:  7   Start: 09/17/2017   Class: Normal   Requested on: 08/30/2016   Last ordered: 1 year ago by Brock Badharles A Harper, MD Last refill: 05/08/2017   Rx #: [id number]

## 2018-07-17 ENCOUNTER — Ambulatory Visit (INDEPENDENT_AMBULATORY_CARE_PROVIDER_SITE_OTHER): Payer: 59 | Admitting: Obstetrics

## 2018-07-17 ENCOUNTER — Other Ambulatory Visit: Payer: Self-pay

## 2018-07-17 ENCOUNTER — Encounter: Payer: Self-pay | Admitting: Obstetrics

## 2018-07-17 VITALS — BP 130/79 | HR 80 | Ht 62.0 in | Wt 171.0 lb

## 2018-07-17 DIAGNOSIS — N898 Other specified noninflammatory disorders of vagina: Secondary | ICD-10-CM

## 2018-07-17 DIAGNOSIS — Z01419 Encounter for gynecological examination (general) (routine) without abnormal findings: Secondary | ICD-10-CM

## 2018-07-17 DIAGNOSIS — Z3044 Encounter for surveillance of vaginal ring hormonal contraceptive device: Secondary | ICD-10-CM

## 2018-07-17 DIAGNOSIS — Z124 Encounter for screening for malignant neoplasm of cervix: Secondary | ICD-10-CM | POA: Diagnosis not present

## 2018-07-17 DIAGNOSIS — Z113 Encounter for screening for infections with a predominantly sexual mode of transmission: Secondary | ICD-10-CM

## 2018-07-17 DIAGNOSIS — Z1151 Encounter for screening for human papillomavirus (HPV): Secondary | ICD-10-CM

## 2018-07-17 MED ORDER — ETONOGESTREL-ETHINYL ESTRADIOL 0.12-0.015 MG/24HR VA RING: VAGINAL_RING | VAGINAL | 12 refills | Status: DC

## 2018-07-17 NOTE — Progress Notes (Signed)
Subjective:        Susan Burke is a 32 y.o. female here for a routine exam.  Current complaints: NONE.    Personal health questionnaire:  Is patient [name], have a family history of breast and/or ovarian cancer: no Is there a family history of uterine cancer diagnosed at age < 73, gastrointestinal cancer, urinary tract cancer, family member who is a Personnel officer syndrome-associated carrier: no Is the patient overweight and hypertensive, family history of diabetes, personal history of gestational diabetes, preeclampsia or PCOS: no Is patient over 55, have PCOS,  family history of premature CHD under age 44, diabetes, smoke, have hypertension or peripheral artery disease:  no At any time, has a partner hit, kicked or otherwise hurt or frightened you?: no Over the past 2 weeks, have you felt down, depressed or hopeless?: no Over the past 2 weeks, have you felt little interest or pleasure in doing things?:no   Gynecologic History Patient's last menstrual period was 06/25/2018 (exact date). Contraception: [name] vaginal inserts Last Pap: 2016. Results were: normal Last mammogram: N/A. Results were: N/A  Obstetric History OB History  Gravida Para Term Preterm AB Living  2 2 2  0 0 2  SAB TAB Ectopic Multiple Live Births  0 0 0 0 2    # Outcome Date GA Lbr Len/2nd Weight Sex Delivery Anes PTL Lv  2 Term 11/29/11 [redacted]w[redacted]d 04:30 / 00:19 8 lb 5.9 oz (3.795 kg) M Vag-Spont EPI  LIV     Birth Comments: NA  1 Term 2010 [redacted]w[redacted]d 10:00 5 lb (2.268 kg) F Vag-Spont EPI  LIV    Past Medical History:  Diagnosis Date  . Pregnancy induced hypertension     History reviewed. No pertinent surgical history.   Current Outpatient Medications:  .  etonogestrel-ethinyl estradiol ([name]) 0.12-0.015 MG/24HR vaginal ring, PLACE 1 VAGINALLY EVERY 28 DAYS. INSERT AND LEAVE IN PLACE FOR 3 CONSECUTIVE WKS, THEN REMOVE X 1WK, Disp: 1 each, Rfl: 12 Allergies  Allergen Reactions  . Penicillins Rash     Social History   Tobacco Use  . Smoking status: Never Smoker  . Smokeless tobacco: Never Used  Substance Use Topics  . Alcohol use: No    Alcohol/week: 0.0 standard drinks    Family History  Problem Relation Age of Onset  . Diabetes Maternal Grandmother   . Hypertension Maternal Grandmother       Review of Systems  Constitutional: negative for fatigue and weight loss Respiratory: negative for cough and wheezing Cardiovascular: negative for chest pain, fatigue and palpitations Gastrointestinal: negative for abdominal pain and change in bowel habits Musculoskeletal:negative for myalgias Neurological: negative for gait problems and tremors Behavioral/Psych: negative for abusive relationship, depression Endocrine: negative for temperature intolerance    Genitourinary:negative for abnormal menstrual periods, genital lesions, hot flashes, sexual problems and vaginal discharge Integument/breast: negative for breast lump, breast tenderness, nipple discharge and skin lesion(s)    Objective:       BP 130/79   Pulse 80   Ht 5\' 2"  (1.575 m)   Wt 171 lb (77.6 kg)   LMP 06/25/2018 (Exact Date)   BMI 31.28 kg/m  General:   alert  Skin:   no rash or abnormalities  Lungs:   clear to auscultation bilaterally  Heart:   regular rate and rhythm, S1, S2 normal, no murmur, click, rub or gallop  Breasts:   normal without suspicious masses, skin or nipple changes or axillary nodes  Abdomen:  normal findings: no organomegaly,  soft, non-tender and no hernia  Pelvis:  External genitalia: normal general appearance Urinary system: urethral meatus normal and bladder without fullness, nontender Vaginal: normal without tenderness, induration or masses Cervix: normal appearance Adnexa: normal bimanual exam Uterus: anteverted and non-tender, normal size   Lab Review Urine pregnancy test Labs reviewed yes Radiologic studies reviewed no  50% of 20 min visit spent on counseling and coordination  of care.   Assessment:     1. Encounter for routine gynecological examination with Papanicolaou smear of cervix  - Cytology - PAP( [place])  2. Vaginal discharge Rx: - Cervicovaginal ancillary only( [place])  3. Encounter for surveillance of vaginal ring hormonal contraceptive device Rx: - etonogestrel-ethinyl estradiol ([name]) 0.12-0.015 MG/24HR vaginal ring; PLACE 1 VAGINALLY EVERY 28 DAYS. INSERT AND LEAVE IN PLACE FOR 3 CONSECUTIVE WKS, THEN REMOVE X 1WK  Dispense: 1 each; Refill: 12    Plan:    Education reviewed: calcium supplements, depression evaluation, low fat, low cholesterol diet, safe sex/STD prevention, self breast exams and weight bearing exercise. Contraception: [name] vaginal inserts. Follow up in: 1 year.   Meds ordered this encounter  Medications  . etonogestrel-ethinyl estradiol ([name]) 0.12-0.015 MG/24HR vaginal ring    Sig: PLACE 1 VAGINALLY EVERY 28 DAYS. INSERT AND LEAVE IN PLACE FOR 3 CONSECUTIVE WKS, THEN REMOVE X 1WK    Dispense:  1 each    Refill:  12   No orders of the defined types were placed in this encounter.   Brock BadHARLES A. Dylan Ruotolo MD 07-17-2018

## 2018-07-17 NOTE — Progress Notes (Signed)
Presents for AEX/PAP/Culture. Declined FLU vaccine.  On [name], needs refill.

## 2018-07-18 LAB — CERVICOVAGINAL ANCILLARY ONLY
Bacterial vaginitis: NEGATIVE
CHLAMYDIA, DNA PROBE: NEGATIVE
Candida vaginitis: NEGATIVE
NEISSERIA GONORRHEA: NEGATIVE
TRICH (WINDOWPATH): NEGATIVE

## 2018-07-19 LAB — CYTOLOGY - PAP
DIAGNOSIS: NEGATIVE
HPV (WINDOPATH): NOT DETECTED

## 2019-07-21 ENCOUNTER — Ambulatory Visit (INDEPENDENT_AMBULATORY_CARE_PROVIDER_SITE_OTHER): Payer: 59 | Admitting: Obstetrics

## 2019-07-21 ENCOUNTER — Other Ambulatory Visit: Payer: Self-pay

## 2019-07-21 ENCOUNTER — Encounter: Payer: Self-pay | Admitting: Obstetrics

## 2019-07-21 VITALS — BP 139/86 | HR 76 | Temp 98.8°F | Ht 62.0 in | Wt 181.0 lb

## 2019-07-21 DIAGNOSIS — Z01419 Encounter for gynecological examination (general) (routine) without abnormal findings: Secondary | ICD-10-CM | POA: Diagnosis not present

## 2019-07-21 DIAGNOSIS — Z3044 Encounter for surveillance of vaginal ring hormonal contraceptive device: Secondary | ICD-10-CM

## 2019-07-21 DIAGNOSIS — N898 Other specified noninflammatory disorders of vagina: Secondary | ICD-10-CM

## 2019-07-21 DIAGNOSIS — Z113 Encounter for screening for infections with a predominantly sexual mode of transmission: Secondary | ICD-10-CM

## 2019-07-21 DIAGNOSIS — Z30011 Encounter for initial prescription of contraceptive pills: Secondary | ICD-10-CM

## 2019-07-21 DIAGNOSIS — Z124 Encounter for screening for malignant neoplasm of cervix: Secondary | ICD-10-CM

## 2019-07-21 DIAGNOSIS — E669 Obesity, unspecified: Secondary | ICD-10-CM

## 2019-07-21 DIAGNOSIS — Z1151 Encounter for screening for human papillomavirus (HPV): Secondary | ICD-10-CM

## 2019-07-21 DIAGNOSIS — E66811 Obesity, class 1: Secondary | ICD-10-CM

## 2019-07-21 DIAGNOSIS — Z3009 Encounter for other general counseling and advice on contraception: Secondary | ICD-10-CM

## 2019-07-21 MED ORDER — SLYND 4 MG PO TABS: 1.0000 | ORAL_TABLET | Freq: Every day | ORAL | 11 refills | Status: DC

## 2019-07-21 NOTE — Progress Notes (Addendum)
Subjective:        Susan Burke is a 33 y.o. female here for a routine exam.  Current complaints: None.    Personal health questionnaire:  Is patient [name], have a family history of breast and/or ovarian cancer: no Is there a family history of uterine cancer diagnosed at age < 74, gastrointestinal cancer, urinary tract cancer, family member who is a Field seismologist syndrome-associated carrier: no Is the patient overweight and hypertensive, family history of diabetes, personal history of gestational diabetes, preeclampsia or PCOS: yes Is patient over 15, have PCOS,  family history of premature CHD under age 73, diabetes, smoke, have hypertension or peripheral artery disease:  yes At any time, has a partner hit, kicked or otherwise hurt or frightened you?: no Over the past 2 weeks, have you felt down, depressed or hopeless?: no Over the past 2 weeks, have you felt little interest or pleasure in doing things?:no   Gynecologic History Patient's last menstrual period was 07/16/2019 (exact date). Contraception: oral progesterone-only contraceptive Last Pap: 07-17-2018. Results were: normal Last mammogram: n/a. Results were: n/a  Obstetric History OB History  Gravida Para Term Preterm AB Living  2 2 2  0 0 2  SAB TAB Ectopic Multiple Live Births  0 0 0 0 2    # Outcome Date GA Lbr Len/2nd Weight Sex Delivery Anes PTL Lv  2 Term 11/29/11 [redacted]w[redacted]d 04:30 / 00:19 8 lb 5.9 oz (3.795 kg) M Vag-Spont EPI  LIV     Birth Comments: NA  1 Term 2010 [redacted]w[redacted]d 10:00 5 lb (2.268 kg) F Vag-Spont EPI  LIV    Past Medical History:  Diagnosis Date  . Pregnancy induced hypertension     History reviewed. No pertinent surgical history.   Current Outpatient Medications:  .  Drospirenone (SLYND) 4 MG TABS, Take 1 tablet by mouth daily before breakfast., Disp: 28 tablet, Rfl: 11 .  etonogestrel-ethinyl estradiol ([name]) 0.12-0.015 MG/24HR vaginal ring, PLACE 1 VAGINALLY EVERY 28 DAYS. INSERT AND LEAVE  IN PLACE FOR 3 CONSECUTIVE WKS, THEN REMOVE X 1WK, Disp: 1 each, Rfl: 12 Allergies  Allergen Reactions  . Penicillins Rash    Social History   Tobacco Use  . Smoking status: Never Smoker  . Smokeless tobacco: Never Used  Substance Use Topics  . Alcohol use: No    Alcohol/week: 0.0 standard drinks    Family History  Problem Relation Age of Onset  . Diabetes Maternal Grandmother   . Hypertension Maternal Grandmother       Review of Systems  Constitutional: negative for fatigue and weight loss Respiratory: negative for cough and wheezing Cardiovascular: negative for chest pain, fatigue and palpitations Gastrointestinal: negative for abdominal pain and change in bowel habits Musculoskeletal:negative for myalgias Neurological: negative for gait problems and tremors Behavioral/Psych: negative for abusive relationship, depression Endocrine: negative for temperature intolerance    Genitourinary:negative for abnormal menstrual periods, genital lesions, hot flashes, sexual problems and vaginal discharge Integument/breast: negative for breast lump, breast tenderness, nipple discharge and skin lesion(s)    Objective:       BP 139/86 (BP Location: Left Arm, Cuff Size: Normal)   Pulse 76   Temp 98.8 F (37.1 C)   Ht 5\' 2"  (1.575 m)   Wt 181 lb (82.1 kg)   LMP 07/16/2019 (Exact Date)   BMI 33.11 kg/m  General:   alert  Skin:   no rash or abnormalities  Lungs:   clear to auscultation bilaterally  Heart:   regular  rate and rhythm, S1, S2 normal, no murmur, click, rub or gallop  Breasts:   normal without suspicious masses, skin or nipple changes or axillary nodes  Abdomen:  normal findings: no organomegaly, soft, non-tender and no hernia  Pelvis:  External genitalia: normal general appearance Urinary system: urethral meatus normal and bladder without fullness, nontender Vaginal: normal without tenderness, induration or masses Cervix: normal appearance Adnexa: normal bimanual  exam Uterus: anteverted and non-tender, normal size   Lab Review Urine pregnancy test Labs reviewed yes Radiologic studies reviewed no  50% of 25 min visit spent on counseling and coordination of care.   Assessment:     1. Encounter for gynecological examination with Papanicolaou smear of cervix Rx: - Cytology - PAP( [place])  2. Vaginal discharge Rx: - Cervicovaginal ancillary only( [place])  3. Encounter for surveillance of vaginal ring hormonal contraceptive device - wants to discontinue [name]  4. Encounter for other general counseling and advice on contraception - wants to start OCP's - progestin-only ( [place] ) pill recommended because of estrogen risk factors of borderline HTN, obesity and history of headaches  5. Encounter for initial prescription of contraceptive pills Rx: - Drospirenone (SLYND) 4 MG TABS; Take 1 tablet by mouth daily before breakfast.  Dispense: 28 tablet; Refill: 11  6. Obesity (BMI 30.0-34.9) - program of caloric restriction, exercise and behavioral modification recommended for weight reduction and long-term maintenance   Plan:    Education reviewed: calcium supplements, depression evaluation, low fat, low cholesterol diet, safe sex/STD prevention, self breast exams and weight bearing exercise. Contraception: oral progesterone-only contraceptive. Follow up in: 6 months.    Meds ordered this encounter  Medications  . Drospirenone (SLYND) 4 MG TABS    Sig: Take 1 tablet by mouth daily before breakfast.    Dispense:  28 tablet    Refill:  11    Brock Bad, MD 07/21/2019 3:56 PM   GYN presents for AEX/PAP.  She is using [name] for [facility] but wants to change to OCP.  GAD-7=1

## 2019-07-22 LAB — CERVICOVAGINAL ANCILLARY ONLY
Bacterial Vaginitis (gardnerella): NEGATIVE
Candida Glabrata: NEGATIVE
Candida Vaginitis: NEGATIVE
Chlamydia: NEGATIVE
Comment: NEGATIVE
Comment: NEGATIVE
Comment: NEGATIVE
Comment: NEGATIVE
Comment: NEGATIVE
Comment: NORMAL
Neisseria Gonorrhea: NEGATIVE
Trichomonas: NEGATIVE

## 2019-07-23 LAB — CYTOLOGY - PAP
Comment: NEGATIVE
Diagnosis: NEGATIVE
High risk HPV: NEGATIVE

## 2019-08-12 ENCOUNTER — Other Ambulatory Visit: Payer: Self-pay | Admitting: Obstetrics

## 2019-08-12 DIAGNOSIS — Z3044 Encounter for surveillance of vaginal ring hormonal contraceptive device: Secondary | ICD-10-CM

## 2019-12-31 ENCOUNTER — Other Ambulatory Visit: Payer: Self-pay

## 2019-12-31 DIAGNOSIS — Z3044 Encounter for surveillance of vaginal ring hormonal contraceptive device: Secondary | ICD-10-CM

## 2019-12-31 MED ORDER — ETONOGESTREL-ETHINYL ESTRADIOL 0.12-0.015 MG/24HR VA RING: VAGINAL_RING | VAGINAL | 6 refills | Status: DC

## 2020-09-16 ENCOUNTER — Other Ambulatory Visit: Payer: 59

## 2020-11-23 ENCOUNTER — Other Ambulatory Visit: Payer: Self-pay

## 2020-11-23 DIAGNOSIS — Z3044 Encounter for surveillance of vaginal ring hormonal contraceptive device: Secondary | ICD-10-CM

## 2020-11-23 MED ORDER — ETONOGESTREL-ETHINYL ESTRADIOL 0.12-0.015 MG/24HR VA RING: VAGINAL_RING | VAGINAL | 6 refills | Status: AC

## 2020-11-23 NOTE — Telephone Encounter (Signed)
[facility] sent over request for RF of [name]. Rx renewed.

## 2021-06-30 ENCOUNTER — Telehealth: Payer: 59

## 2021-06-30 ENCOUNTER — Encounter: Payer: Self-pay | Admitting: Physician Assistant

## 2021-06-30 ENCOUNTER — Telehealth: Payer: 59 | Admitting: Physician Assistant

## 2021-06-30 DIAGNOSIS — B3731 Acute candidiasis of vulva and vagina: Secondary | ICD-10-CM | POA: Diagnosis not present

## 2021-06-30 MED ORDER — FLUCONAZOLE 150 MG PO TABS: 150.0000 mg | ORAL_TABLET | Freq: Once | ORAL | 0 refills | Status: AC

## 2021-06-30 NOTE — Progress Notes (Signed)
Virtual Visit Consent   Susan Burke, you are scheduled for a virtual visit with a [facility] provider today.     Just as with appointments in the office, your consent must be obtained to participate.  Your consent will be active for this visit and any virtual visit you may have with one of our providers in the next 365 days.     If you have a MyChart account, a copy of this consent can be sent to you electronically.  All virtual visits are billed to your insurance company just like a traditional visit in the office.    As this is a virtual visit, video technology does not allow for your provider to perform a traditional examination.  This may limit your provider's ability to fully assess your condition.  If your provider identifies any concerns that need to be evaluated in person or the need to arrange testing (such as labs, EKG, etc.), we will make arrangements to do so.     Although advances in technology are sophisticated, we cannot ensure that it will always work on either your end or our end.  If the connection with a video visit is poor, the visit may have to be switched to a telephone visit.  With either a video or telephone visit, we are not always able to ensure that we have a secure connection.     I need to obtain your verbal consent now.   Are you willing to proceed with your visit today?    Susan Burke has provided verbal consent on 06/30/2021 for a virtual visit (video or telephone).   Piedad Climes, [place]   Date: 06/30/2021 10:08 AM   Virtual Visit via Video Note   I, Piedad Climes, connected with  Susan Burke  ([id number], June 14, 1986) on 06/30/21 at 10:00 AM EDT by a video-enabled telemedicine application and verified that I am speaking with the correct person using two identifiers.  Location: Patient: Virtual Visit Location Patient: Home Provider: Virtual Visit Location Provider: Home Office   I discussed the limitations of evaluation and management by  telemedicine and the availability of in person appointments. The patient expressed understanding and agreed to proceed.    History of Present Illness: Susan Burke is a 35 y.o. who identifies as a female who was assigned female at birth, and is being seen today for possible yeast infection. Notes symptoms starting prior to her period last week but still continued after. Notes white, thicker vaginal discharge with itching. Denies any significant odor or fishy odor. Denies vaginal pain. Denies concern for STI. Denies urinary symptoms.   HPI: HPI  Problems:  Patient Active Problem List   Diagnosis Date Noted   HYPERTENSION 03/23/2009    Allergies:  Allergies  Allergen Reactions   Penicillins Rash   Medications:  Current Outpatient Medications:    fluconazole (DIFLUCAN) 150 MG tablet, Take 1 tablet (150 mg total) by mouth once for 1 dose., Disp: 1 tablet, Rfl: 0   etonogestrel-ethinyl estradiol ([name]) 0.12-0.015 MG/24HR vaginal ring, PLACE 1 VAGINALLY EVERY 28 DAYS. INSERT AND LEAVE IN PLACE FOR 3 CONSECUTIVE WKS, THEN REMOVE X 1WK, Disp: 1 each, Rfl: 6  Observations/Objective: Patient is well-developed, well-nourished in no acute distress.  Resting comfortably at home.  Head is normocephalic, atraumatic.  No labored breathing. Speech is clear and coherent with logical content.  Patient is alert and oriented at baseline.   Assessment and Plan: 1. Yeast vaginitis - fluconazole (DIFLUCAN)  150 MG tablet; Take 1 tablet (150 mg total) by mouth once for 1 dose.  Dispense: 1 tablet; Refill: 0 Most likely yeast giving symptoms currently. Will start empiric treatment with Diflucan. Supportive measures discussed. She is to message [place] if symptoms are not completely resolving or any new/worsening symptoms.   Follow Up Instructions: I discussed the assessment and treatment plan with the patient. The patient was provided an opportunity to ask questions and all were answered. The patient agreed  with the plan and demonstrated an understanding of the instructions.  A copy of instructions were sent to the patient via MyChart unless otherwise noted below.   The patient was advised to call back or seek an in-person evaluation if the symptoms worsen or if the condition fails to improve as anticipated.  Time:  I spent 10 minutes with the patient via telehealth technology discussing the above problems/concerns.    Piedad Climes, PA-C

## 2021-06-30 NOTE — Patient Instructions (Signed)
  Susan Burke, thank you for joining Piedad Climes, PA-C for today's virtual visit.  While this provider is not your primary care provider (PCP), if your PCP is located in our provider database this encounter information will be shared with them immediately following your visit.  Consent: (Patient) Susan Burke provided verbal consent for this virtual visit at the beginning of the encounter.  Current Medications:  Current Outpatient Medications:    Drospirenone (SLYND) 4 MG TABS, Take 1 tablet by mouth daily before breakfast., Disp: 28 tablet, Rfl: 11   etonogestrel-ethinyl estradiol ([name]) 0.12-0.015 MG/24HR vaginal ring, PLACE 1 VAGINALLY EVERY 28 DAYS. INSERT AND LEAVE IN PLACE FOR 3 CONSECUTIVE WKS, THEN REMOVE X 1WK, Disp: 1 each, Rfl: 6   Medications ordered in this encounter:  No orders of the defined types were placed in this encounter.    *If you need refills on other medications prior to your next appointment, please contact your pharmacy*  Follow-Up: Call back or seek an in-person evaluation if the symptoms worsen or if the condition fails to improve as anticipated.  Other Instructions Please take the Diflucan as directed. Keep hydrated.  Avoid scented or dyed soaps/lotions used in the groin area.  If not resolving, please let me know via MyChart so we can reassess.   If you have been instructed to have an in-person evaluation today at a local Urgent Care facility, please use the link below. It will take you to a list of all of our available [facility], including address, phone number and hours of operation. Please do not delay care.  [facility]  If you or a family member do not have a primary care provider, use the link below to schedule a visit and establish care. When you choose a [facility] primary care physician or advanced practice provider, you gain a long-term partner in health. Find a Primary Care Provider  Learn more  about [facility]'s in-office and virtual care options: [facility] - Get Care Now

## 2021-09-28 ENCOUNTER — Telehealth: Payer: 59 | Admitting: Physician Assistant

## 2021-09-28 DIAGNOSIS — J029 Acute pharyngitis, unspecified: Secondary | ICD-10-CM

## 2021-09-28 NOTE — Progress Notes (Signed)
E-Visit for Sore Throat   We are sorry that you are not feeling well.  Here is how we plan to help!   Your symptoms indicate a likely viral infection (Pharyngitis).   Pharyngitis is inflammation in the back of the throat which can cause a sore throat, scratchiness and sometimes difficulty swallowing.   Pharyngitis is typically caused by a respiratory virus and will just run its course.  Please keep in mind that your symptoms could last up to 10 days.  For throat pain, we recommend over the counter oral pain relief medications such as acetaminophen or aspirin, or anti-inflammatory medications such as ibuprofen or naproxen sodium.  Topical treatments such as oral throat lozenges or sprays may be used as needed.  Avoid close contact with loved ones, especially the very young and elderly.  Remember to wash your hands thoroughly throughout the day as this is the number one way to prevent the spread of infection and wipe down door knobs and counters with disinfectant.   After careful review of your answers, I would not recommend an antibiotic for your condition.  Antibiotics should not be used to treat conditions that we suspect are caused by viruses like the virus that causes the common cold or flu. However, some people can have Strep with atypical symptoms. You may need formal testing in clinic or office to confirm if your symptoms continue or worsen.   Providers prescribe antibiotics to treat infections caused by bacteria. Antibiotics are very powerful in treating bacterial infections when they are used properly.  To maintain their effectiveness, they should be used only when necessary.  Overuse of antibiotics has resulted in the development of super bugs that are resistant to treatment!     Home Care: Only take medications as instructed by your medical team. Do not drink alcohol while taking these medications. A steam or ultrasonic humidifier can help congestion.  You can place a towel over your head and  breathe in the steam from hot water coming from a faucet. Avoid close contacts especially the very young and the elderly. Cover your mouth when you cough or sneeze. Always remember to wash your hands.   Get Help Right Away If: You develop worsening fever or throat pain. You develop a severe head ache or visual changes. Your symptoms persist after you have completed your treatment plan.   Make sure you Understand these instructions. Will watch your condition. Will get help right away if you are not doing well or get worse.     Thank you for choosing an e-visit.   Your e-visit answers were reviewed by a board certified advanced clinical practitioner to complete your personal care plan. Depending upon the condition, your plan could have included both over the counter or prescription medications.   Please review your pharmacy choice. Make sure the pharmacy is open so you can pick up prescription now. If there is a problem, you may contact your provider through MyChart messaging and have the prescription routed to another pharmacy.  Your safety is important to us. If you have drug allergies check your prescription carefully.    For the next 24 hours you can use MyChart to ask questions about today's visit, request a non-urgent call back, or ask for a work or school excuse. You will get an email in the next two days asking about your experience. I hope that your e-visit has been valuable and will speed your recovery.  

## 2021-09-28 NOTE — Progress Notes (Signed)
I have spent 5 minutes in review of e-visit questionnaire, review and updating patient chart, medical decision making and response to patient.   Azarah Dacy Cody Malin Cervini, PA-C    

## 2022-11-06 ENCOUNTER — Telehealth: Payer: 59 | Admitting: Nurse Practitioner

## 2022-11-06 DIAGNOSIS — J069 Acute upper respiratory infection, unspecified: Secondary | ICD-10-CM

## 2022-11-06 NOTE — Progress Notes (Signed)
E-Visit for Upper Respiratory Infection   We are sorry you are not feeling well.  Here is how we plan to help! We recommend taking a home pregnancy test prior to using any over the counter medications as many are restricted during pregnancy.  If you are found to be pregnant we would recommend saline nasal sprays, warm salt water gargles, throat lozenges with honey, and tylenol for relief along with a follow up with your OBGYN to discuss further management  Based on what you have shared with me, it looks like you may have a viral upper respiratory infection.  Upper respiratory infections are caused by a large number of viruses; however, rhinovirus is the most common cause.   Symptoms vary from person to person, with common symptoms including sore throat, cough, fatigue or lack of energy and feeling of general discomfort.  A low-grade fever of up to 100.4 may present, but is often uncommon.  Symptoms vary however, and are closely related to a person's age or underlying illnesses.  The most common symptoms associated with an upper respiratory infection are nasal discharge or congestion, cough, sneezing, headache and pressure in the ears and face.  These symptoms usually persist for about 3 to 10 days, but can last up to 2 weeks.  It is important to know that upper respiratory infections do not cause serious illness or complications in most cases.    Upper respiratory infections can be transmitted from person to person, with the most common method of transmission being a person's hands.  The virus is able to live on the skin and can infect other persons for up to 2 hours after direct contact.  Also, these can be transmitted when someone coughs or sneezes; thus, it is important to cover the mouth to reduce this risk.  To keep the spread of the illness at [place], good hand hygiene is very important.  This is an infection that is most likely caused by a virus. There are no specific treatments other than to help  you with the symptoms until the infection runs its course.  We are sorry you are not feeling well.  Here is how we plan to help!   For nasal congestion, you may use an oral decongestants such as Mucinex D or if you have glaucoma or high blood pressure use plain Mucinex.  Saline nasal spray or nasal drops can help and can safely be used as often as needed for congestion.    If you do not have a history of heart disease, hypertension, diabetes or thyroid disease, prostate/bladder issues or glaucoma, you may also use Sudafed to treat nasal congestion.  It is highly recommended that you consult with a pharmacist or your primary care physician to ensure this medication is safe for you to take.     If you have a cough, you may use cough suppressants such as Delsym and Robitussin.  If you have glaucoma or high blood pressure, you can also use Coricidin HBP.     If you have a sore or scratchy throat, use a saltwater gargle-  to  teaspoon of salt dissolved in a 4-ounce to 8-ounce glass of warm water.  Gargle the solution for approximately 15-30 seconds and then spit.  It is important not to swallow the solution.  You can also use throat lozenges/cough drops and Chloraseptic spray to help with throat pain or discomfort.  Warm or cold liquids can also be helpful in relieving throat pain.  For headache, pain  or general discomfort, you can use Ibuprofen or Tylenol as directed.   Some authorities believe that zinc sprays or the use of Echinacea may shorten the course of your symptoms.   HOME CARE Only take medications as instructed by your medical team. Be sure to drink plenty of fluids. Water is fine as well as fruit juices, sodas and electrolyte beverages. You may want to stay away from caffeine or alcohol. If you are nauseated, try taking small sips of liquids. How do you know if you are getting enough fluid? Your urine should be a pale yellow or almost colorless. Get rest. Taking a steamy shower or using  a humidifier may help nasal congestion and ease sore throat pain. You can place a towel over your head and breathe in the steam from hot water coming from a faucet. Using a saline nasal spray works much the same way. Cough drops, hard candies and sore throat lozenges may ease your cough. Avoid close contacts especially the very young and the elderly Cover your mouth if you cough or sneeze Always remember to wash your hands.   GET HELP RIGHT AWAY IF: You develop worsening fever. If your symptoms do not improve within 10 days You develop yellow or green discharge from your nose over 3 days. You have coughing fits You develop a severe head ache or visual changes. You develop shortness of breath, difficulty breathing or start having chest pain Your symptoms persist after you have completed your treatment plan  MAKE SURE YOU  Understand these instructions. Will watch your condition. Will get help right away if you are not doing well or get worse.  Thank you for choosing an e-visit.  Your e-visit answers were reviewed by a board certified advanced clinical practitioner to complete your personal care plan. Depending upon the condition, your plan could have included both over the counter or prescription medications.  Please review your pharmacy choice. Make sure the pharmacy is open so you can pick up prescription now. If there is a problem, you may contact your provider through [employer] and have the prescription routed to another pharmacy.  Your safety is important to [place]. If you have drug allergies check your prescription carefully.   For the next 24 hours you can use MyChart to ask questions about today's visit, request a non-urgent call back, or ask for a work or school excuse. You will get an email in the next two days asking about your experience. I hope that your e-visit has been valuable and will speed your recovery.  I spent approximately 5 minutes reviewing the patient's  history, current symptoms and coordinating their care today.

## 2022-12-07 ENCOUNTER — Emergency Department (HOSPITAL_BASED_OUTPATIENT_CLINIC_OR_DEPARTMENT_OTHER): Payer: 59

## 2022-12-07 ENCOUNTER — Emergency Department (HOSPITAL_BASED_OUTPATIENT_CLINIC_OR_DEPARTMENT_OTHER)
Admission: EM | Admit: 2022-12-07 | Discharge: 2022-12-07 | Disposition: A | Discharge status: Home / Self Care | Payer: 59 | Attending: Emergency Medicine | Admitting: Emergency Medicine

## 2022-12-07 ENCOUNTER — Other Ambulatory Visit: Payer: Self-pay

## 2022-12-07 ENCOUNTER — Encounter (HOSPITAL_BASED_OUTPATIENT_CLINIC_OR_DEPARTMENT_OTHER): Payer: Self-pay | Admitting: Emergency Medicine

## 2022-12-07 DIAGNOSIS — Z3A00 Weeks of gestation of pregnancy not specified: Secondary | ICD-10-CM | POA: Insufficient documentation

## 2022-12-07 DIAGNOSIS — O26891 Other specified pregnancy related conditions, first trimester: Secondary | ICD-10-CM | POA: Insufficient documentation

## 2022-12-07 DIAGNOSIS — N939 Abnormal uterine and vaginal bleeding, unspecified: Secondary | ICD-10-CM

## 2022-12-07 DIAGNOSIS — O3680X0 Pregnancy with inconclusive fetal viability, not applicable or unspecified: Secondary | ICD-10-CM

## 2022-12-07 LAB — BASIC METABOLIC PANEL
Anion gap: 8 (ref 5–15)
BUN: 9 mg/dL (ref 6–20)
CO2: 22 mmol/L (ref 22–32)
Calcium: 8.4 mg/dL — ABNORMAL LOW (ref 8.9–10.3)
Chloride: 104 mmol/L (ref 98–111)
Creatinine, Ser: 0.71 mg/dL (ref 0.44–1.00)
GFR, Estimated: >60 mL/min (ref >60)
Glucose, Bld: 108 mg/dL — ABNORMAL HIGH (ref 70–99)
Potassium: 3.5 mmol/L (ref 3.5–5.1)
Sodium: 134 mmol/L — ABNORMAL LOW (ref 135–145)

## 2022-12-07 LAB — CBC WITH DIFFERENTIAL/PLATELET
Abs Immature Granulocytes: 0.02 10*3/uL (ref 0.00–0.07)
Basophils Absolute: 0 10*3/uL (ref 0.0–0.1)
Basophils Relative: 0 %
Eosinophils Absolute: 0.3 10*3/uL (ref 0.0–0.5)
Eosinophils Relative: 4 %
HCT: 35.3 % — ABNORMAL LOW (ref 36.0–46.0)
Hemoglobin: 11.6 g/dL — ABNORMAL LOW (ref 12.0–15.0)
Immature Granulocytes: 0 %
Lymphocytes Relative: 40 %
Lymphs Abs: 3.1 10*3/uL (ref 0.7–4.0)
MCH: 25.8 pg — ABNORMAL LOW (ref 26.0–34.0)
MCHC: 32.9 g/dL (ref 30.0–36.0)
MCV: 78.4 fL — ABNORMAL LOW (ref 80.0–100.0)
Monocytes Absolute: 0.6 10*3/uL (ref 0.1–1.0)
Monocytes Relative: 7 %
Neutro Abs: 3.7 10*3/uL (ref 1.7–7.7)
Neutrophils Relative %: 49 %
Platelets: 290 10*3/uL (ref 150–400)
RBC: 4.5 MIL/uL (ref 3.87–5.11)
RDW: 15 % (ref 11.5–15.5)
WBC: 7.8 10*3/uL (ref 4.0–10.5)
nRBC: 0 % (ref 0.0–0.2)

## 2022-12-07 LAB — HCG, QUANTITATIVE, PREGNANCY: hCG, Beta Chain, Quant, S: 3801 m[IU]/mL — ABNORMAL HIGH (ref <5)

## 2022-12-07 NOTE — Discharge Instructions (Addendum)
It is very important for you to follow-up with your OB doctor for repeat hCG level and repeat ultrasound. Return for severe pain, feeling like you are in a pass out or new concerns.

## 2022-12-07 NOTE — ED Triage Notes (Signed)
Vag bleed started Tuesday and got heavy this am states missed her period in Feb and march took home preg test and it was positive

## 2022-12-07 NOTE — ED Provider Notes (Signed)
[facility] Provider Note   CSN: [id number] Arrival date & time: 12/07/22  0820     History  Chief Complaint  Patient presents with   Vaginal Bleeding    PEIGHTYN RACKOW is a 37 y.o. female.  Patient with history of 2 live births and healthy children presents with new pregnancy.  Patient missed menstrual cycle in February and March had positive home pregnancy test.  Patient started having spotting started Tuesday and then this morning had heavy menstrual bleeding with clots and she thinks tissue.  No history of miscarriage known.  Currently no pain.  No fevers or chills or urinary symptoms.  Patient has gynecologist/OB she can follow-up with.       Home Medications Prior to Admission medications   Medication Sig Start Date End Date Taking? Authorizing Provider  etonogestrel-ethinyl estradiol ([name]) 0.12-0.015 MG/24HR vaginal ring PLACE 1 VAGINALLY EVERY 28 DAYS. INSERT AND LEAVE IN PLACE FOR 3 CONSECUTIVE WKS, THEN REMOVE X 1WK 11/23/20   Shelly Bombard, MD      Allergies    Penicillins    Review of Systems   Review of Systems  Constitutional:  Negative for chills and fever.  HENT:  Negative for congestion.   Eyes:  Negative for visual disturbance.  Respiratory:  Negative for shortness of breath.   Cardiovascular:  Negative for chest pain.  Gastrointestinal:  Negative for abdominal pain and vomiting.  Genitourinary:  Positive for vaginal bleeding. Negative for dysuria and flank pain.  Musculoskeletal:  Negative for back pain, neck pain and neck stiffness.  Skin:  Negative for rash.  Neurological:  Negative for light-headedness and headaches.    Physical Exam Updated Vital Signs BP (!) 152/92 (BP Location: Right Arm)   Pulse 88   Temp 98.2 F (36.8 C) (Oral)   Resp 16   Ht 5\' 2"  ([id number] m)   Wt 82.1 kg   SpO2 100%   BMI 33.11 kg/m  Physical Exam Vitals and nursing note reviewed.  Constitutional:      General:  She is not in acute distress.    Appearance: She is well-developed.  HENT:     Head: Normocephalic and atraumatic.     Mouth/Throat:     Mouth: Mucous membranes are moist.  Eyes:     General:        Right eye: No discharge.        Left eye: No discharge.     Conjunctiva/sclera: Conjunctivae normal.  Neck:     Trachea: No tracheal deviation.  Cardiovascular:     Rate and Rhythm: Normal rate.  Pulmonary:     Effort: Pulmonary effort is normal.  Abdominal:     General: There is no distension.     Palpations: Abdomen is soft.     Tenderness: There is no abdominal tenderness. There is no guarding.  Genitourinary:    Comments: Patient has moderate blood on speculum exam, nontender cervix, cervix palpated with no significant opening Musculoskeletal:     Cervical back: Normal range of motion and neck supple. No rigidity.  Skin:    General: Skin is warm.     Capillary Refill: Capillary refill takes less than 2 seconds.     Findings: No rash.  Neurological:     General: No focal deficit present.     Mental Status: She is alert.     Cranial Nerves: No cranial nerve deficit.  Psychiatric:  Mood and Affect: Mood normal.     ED Results / Procedures / Treatments   Labs (all labs ordered are listed, but only abnormal results are displayed) Labs Reviewed  HCG, QUANTITATIVE, PREGNANCY - Abnormal; Notable for the following components:      Result Value   hCG, Beta Chain, Quant, S 3,801 (*)    All other components within normal limits  CBC WITH DIFFERENTIAL/PLATELET - Abnormal; Notable for the following components:   Hemoglobin 11.6 (*)    HCT 35.3 (*)    MCV 78.4 (*)    MCH 25.8 (*)    All other components within normal limits  BASIC METABOLIC PANEL - Abnormal; Notable for the following components:   Sodium 134 (*)    Glucose, Bld 108 (*)    Calcium 8.4 (*)    All other components within normal limits  URINALYSIS, ROUTINE W REFLEX MICROSCOPIC  PREGNANCY, URINE     EKG None  Radiology US OB LESS THAN 14 WEEKS WITH OB TRANSVAGINAL  Result Date: 12/07/2022 CLINICAL DATA:  Vaginal bleeding during first trimester of pregnancy, LMP 09/21/2022, quantitative beta hCG 3,801 EXAM: OBSTETRIC <14 WK [place] AND TRANSVAGINAL OB US TECHNIQUE: Both transabdominal and transvaginal ultrasound examinations were performed for complete evaluation of the gestation as well as the maternal uterus, adnexal regions, and pelvic cul-de-sac. Transvaginal technique was performed to assess early pregnancy. COMPARISON:  None Available. FINDINGS: Intrauterine gestational sac: Elongated fluid collection within endometrial canal question gestational sac versus endometrial fluid Yolk sac:  Not identified Embryo:  Not identified Cardiac Activity: N/A Heart Rate: N/A  bpm MSD:   mm    w     d CRL:    mm    w    d                  [place] EDC: Subchorionic hemorrhage:  None visualized. Maternal uterus/adnexae: Maternal uterus otherwise unremarkable. Ovaries normal appearance. No free pelvic fluid or adnexal masses. IMPRESSION: Elongated fluid collection identified within endometrial canal, could represent an abnormal elongated gestational sac or endometrial fluid. No yolk sac or fetal pole identified. Findings are consistent with pregnancy of unknown location. Differential diagnosis includes early intrauterine pregnancy too early to visualize, spontaneous abortion, and occult ectopic pregnancy. Serial quantitative beta HCG and or follow-up ultrasound recommended to definitively exclude ectopic pregnancy. Electronically Signed   By: Lavonia Dana M.D.   On: 12/07/2022 11:04    Procedures Procedures    Medications Ordered in ED Medications - No data to display  ED Course/ Medical Decision Making/ A&P                             Medical Decision Making Amount and/or Complexity of Data Reviewed Labs: ordered. Radiology: ordered.   Patient presents with new pregnancy and vaginal bleeding, and  discussed differential with her including threatened miscarriage, miscarriage, other bleeding with normal pregnancy, other.  Blood work ordered independently reviewed no signs of significant anemia electrolytes unremarkable.  Hemoglobin 11.6.  Quant ordered [id number].  Reviewed medical records RH positive history.  Ultrasound ordered for further delineation. Ultrasound results independently reviewed with differential including miscarriage, very early pregnancy, occult ectopic.  Patient is well-appearing, no pain, plan for close follow-up with OB and reasons to return discussed.        Final Clinical Impression(s) / ED Diagnoses Final diagnoses:  Vaginal bleeding    Rx / DC Orders ED Discharge Orders  None         Elnora Morrison, MD 12/07/22 1128

## 2022-12-21 ENCOUNTER — Ambulatory Visit (HOSPITAL_COMMUNITY)
Admission: RE | Admit: 2022-12-21 | Discharge: 2022-12-21 | Disposition: A | Discharge status: Home / Self Care | Payer: 59 | Source: Ambulatory Visit | Attending: Obstetrics & Gynecology | Admitting: Obstetrics & Gynecology

## 2022-12-21 DIAGNOSIS — O3680X0 Pregnancy with inconclusive fetal viability, not applicable or unspecified: Secondary | ICD-10-CM

## 2023-01-01 ENCOUNTER — Other Ambulatory Visit: Payer: 59

## 2023-01-01 DIAGNOSIS — O3680X0 Pregnancy with inconclusive fetal viability, not applicable or unspecified: Secondary | ICD-10-CM

## 2023-01-02 LAB — BETA HCG QUANT (REF LAB): hCG Quant: 3 m[IU]/mL

## 2023-02-12 ENCOUNTER — Ambulatory Visit: Payer: Self-pay | Admitting: Obstetrics

## 2024-10-20 ENCOUNTER — Encounter: Admitting: Obstetrics
